# Patient Record
Sex: Female | Born: 1955 | Race: Black or African American | Hispanic: No | Smoking: Former smoker
Health system: Southern US, Community
[De-identification: ages and names within clinical notes are randomized; demographics above are authoritative.]

## PROBLEM LIST (undated history)

## (undated) DIAGNOSIS — E119 Type 2 diabetes mellitus without complications: Secondary | ICD-10-CM

## (undated) DIAGNOSIS — E7800 Pure hypercholesterolemia, unspecified: Secondary | ICD-10-CM

## (undated) DIAGNOSIS — I10 Essential (primary) hypertension: Secondary | ICD-10-CM

---

## 2003-06-02 ENCOUNTER — Ambulatory Visit (HOSPITAL_COMMUNITY): Admission: RE | Admit: 2003-06-02 | Discharge: 2003-06-02 | Payer: Self-pay | Admitting: *Deleted

## 2007-08-26 ENCOUNTER — Other Ambulatory Visit: Admission: RE | Admit: 2007-08-26 | Discharge: 2007-08-26 | Payer: Self-pay | Admitting: Gynecology

## 2008-07-13 ENCOUNTER — Observation Stay (HOSPITAL_COMMUNITY): Admission: EM | Admit: 2008-07-13 | Discharge: 2008-07-15 | Payer: Self-pay | Admitting: Emergency Medicine

## 2008-07-13 ENCOUNTER — Ambulatory Visit: Payer: Self-pay | Admitting: Internal Medicine

## 2008-07-13 ENCOUNTER — Emergency Department (HOSPITAL_COMMUNITY): Admission: EM | Admit: 2008-07-13 | Discharge: 2008-07-13 | Payer: Self-pay | Admitting: Family Medicine

## 2010-05-14 LAB — COMPREHENSIVE METABOLIC PANEL
AST: 21 U/L (ref 0–37)
CO2: 30 mEq/L (ref 19–32)
Chloride: 102 mEq/L (ref 96–112)
Creatinine, Ser: 0.71 mg/dL (ref 0.4–1.2)
GFR calc non Af Amer: >60 mL/min (ref >60)
Glucose, Bld: 89 mg/dL (ref 70–99)
Potassium: 3.7 mEq/L (ref 3.5–5.1)
Sodium: 139 mEq/L (ref 135–145)
Total Bilirubin: 0.7 mg/dL (ref 0.3–1.2)

## 2010-05-14 LAB — DIFFERENTIAL
Lymphocytes Relative: 28 % (ref 12–46)
Lymphs Abs: 2.1 10*3/uL (ref 0.7–4.0)
Monocytes Relative: 7 % (ref 3–12)
Neutrophils Relative %: 65 % (ref 43–77)

## 2010-05-14 LAB — CBC
HCT: 42 % (ref 36.0–46.0)
HCT: 44.8 % (ref 36.0–46.0)
Hemoglobin: 14.8 g/dL (ref 12.0–15.0)
MCHC: 33 g/dL (ref 30.0–36.0)
MCHC: 33.6 g/dL (ref 30.0–36.0)
MCHC: 34 g/dL (ref 30.0–36.0)
MCV: 84 fL (ref 78.0–100.0)
MCV: 85.6 fL (ref 78.0–100.0)
Platelets: 198 10*3/uL (ref 150–400)
Platelets: 243 10*3/uL (ref 150–400)
RBC: 4.52 MIL/uL (ref 3.87–5.11)
RBC: 4.91 MIL/uL (ref 3.87–5.11)
RDW: 13.3 % (ref 11.5–15.5)
RDW: 13.3 % (ref 11.5–15.5)
WBC: 6.7 10*3/uL (ref 4.0–10.5)

## 2010-05-14 LAB — GLUCOSE, CAPILLARY
Glucose-Capillary: 112 mg/dL — ABNORMAL HIGH (ref 70–99)
Glucose-Capillary: 125 mg/dL — ABNORMAL HIGH (ref 70–99)
Glucose-Capillary: 142 mg/dL — ABNORMAL HIGH (ref 70–99)
Glucose-Capillary: 150 mg/dL — ABNORMAL HIGH (ref 70–99)
Glucose-Capillary: 159 mg/dL — ABNORMAL HIGH (ref 70–99)
Glucose-Capillary: 161 mg/dL — ABNORMAL HIGH (ref 70–99)
Glucose-Capillary: 175 mg/dL — ABNORMAL HIGH (ref 70–99)

## 2010-05-14 LAB — BASIC METABOLIC PANEL
BUN: 12 mg/dL (ref 6–23)
BUN: 8 mg/dL (ref 6–23)
CO2: 29 mEq/L (ref 19–32)
Chloride: 105 mEq/L (ref 96–112)
Creatinine, Ser: 0.73 mg/dL (ref 0.4–1.2)
Potassium: 3.8 mEq/L (ref 3.5–5.1)
Potassium: 6.2 mEq/L — ABNORMAL HIGH (ref 3.5–5.1)
Sodium: 136 mEq/L (ref 135–145)

## 2010-05-14 LAB — HEPARIN LEVEL (UNFRACTIONATED)
Heparin Unfractionated: 0.31 IU/mL (ref 0.30–0.70)
Heparin Unfractionated: <0.1 IU/mL — ABNORMAL LOW (ref 0.30–0.70)

## 2010-05-14 LAB — HEMOGLOBIN A1C
Hgb A1c MFr Bld: 6.2 % — ABNORMAL HIGH (ref 4.6–6.1)
Mean Plasma Glucose: 131 mg/dL

## 2010-05-14 LAB — LIPID PANEL
Cholesterol: 181 mg/dL (ref 0–200)
Triglycerides: 164 mg/dL — ABNORMAL HIGH (ref <150)

## 2010-05-14 LAB — CK TOTAL AND CKMB (NOT AT ARMC)
CK, MB: 1.2 ng/mL (ref 0.3–4.0)
Relative Index: INVALID (ref 0.0–2.5)
Total CK: 87 U/L (ref 7–177)

## 2010-05-14 LAB — CARDIAC PANEL(CRET KIN+CKTOT+MB+TROPI): CK, MB: 0.9 ng/mL (ref 0.3–4.0)

## 2010-05-14 LAB — POCT CARDIAC MARKERS: Troponin i, poc: <0.05 ng/mL (ref 0.00–0.09)

## 2010-05-14 LAB — PROTIME-INR: INR: 1 (ref 0.00–1.49)

## 2010-05-14 LAB — APTT: aPTT: 35 seconds (ref 24–37)

## 2010-06-19 NOTE — H&P (Signed)
Lori Crane           ACCOUNT NO.:  [id number]   MEDICAL RECORD NO.:  [id number]          PATIENT TYPE:  INP   LOCATION:  3703                         FACILITY:  [facility]   PHYSICIAN:  Hillis Range, MD       DATE OF BIRTH:  04/16/1955   DATE OF ADMISSION:  07/13/2008  DATE OF DISCHARGE:                              HISTORY & PHYSICAL   PRIMARY CARDIOLOGIST:  New, will be seen by Dr. Hillis Range.   PRIMARY CARE:  The patient is followed by the [facility] at [facility].   HISTORY OF PRESENT ILLNESS:  Lori Crane is a delightful 55 year old  African American female with no known history of coronary artery  disease, multiple risk factors including uncontrolled hypertension,  uncontrolled diabetes, ongoing tobacco abuse, questionable  hypercholesteremia, and noncompliance with medication and diet,  sedentary lifestyle, and family history of coronary artery disease in  her father.  Lori Crane presents with chest discomfort, onset this  morning after awakening, had some stuttering, sharp pressure  substernally, no associated symptoms, that sort of waxed and waned for a  few minutes and disappeared and then on the way to work it returned.  She got to work and tried to do her work.  Chest discomfort returned.  She mentioned to the staff at work and decided she need to get  evaluated.  The patient states that the discomfort scared her enough  that she sought medical care because she knows that she does not take  care of herself and she is afraid that something is seriously wrong.  Unfortunately, she is not taking her diabetic medications or her blood  pressure medications in a few days.  She states she just did not like  taking them.  She also is noncompliant with her diabetes.  She does not  check her CBGs and does not follow a diabetic diet.  On arrival to the  emergency room, she was given GI cocktail and nitroglycerin paste with  relief in her discomfort.  Currently  is pain free but very anxious about  being in the hospital.   PAST MEDICAL HISTORY:  1. Hypertension.  2. Diabetes.  3. Tobacco abuse.  4. Noncompliance.   SOCIAL HISTORY:  She lives in [place].  Her mother lives with her.  She has adult children.  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  She is divorced.  She  smokes 1-pack a day.  Denies any regular exercise.  Has an occasional  rum and coke.  Does not follow a restricted diet.  Denies any drug or  illicit substance use.   FAMILY HISTORY:  Mother alive and well.  Father deceased at age 35.  He  had a MI and also had a history of diabetes.   REVIEW OF SYSTEMS:  Positive for chest pain, dyspnea on exertion.  All  other systems reviewed and negative.   ALLERGIES:  No known drug allergies.   CURRENT MEDICATIONS:  1. Aspirin 81.  2. Micardis/hydrochlorothiazide 80/25.  3. Actos Plus 15/500 mg b.i.d., although the patient states  when she      takes it she usually just takes it once a day.   PHYSICAL EXAMINATION:  VITAL SIGNS:  Temperature 98.7, heart rate 81,  respirations 20, blood pressure 155/100, currently she is 162/126, and  saturating 100% on room air.  GENERAL:  In no acute distress.  She is currently pain-free and very  anxious.  HEENT:  Unremarkable.  NECK:  Supple without lymphadenopathy or bruits.  CARDIOVASCULAR:  S1-S2.  Regular rate and rhythm.  LUNGS:  Clear to auscultation bilaterally.  SKIN:  Warm and dry.  ABDOMEN:  Soft and nontender.  Positive bowel sounds.  LOWER EXTREMITIES:  Without clubbing, cyanosis, or edema.  NEUROLOGIC:  Alert and oriented x3.   LABORATORY DATA:  Chest x-ray showing low volume film without acute  cardiopulmonary process.  EKG:  Sinus rhythm, rate is 75.  Point-of-care  negative x1.  Hematocrit 44.8.  Potassium 6.2.  Blood sample was  hemolyzed.  Creatinine 0.6 and glucose 123.   IMPRESSION:  Chest pain, somewhat  typical-atypical features, however,  concerning for angina in patient with multiple comorbidities including  uncontrolled diabetes, uncontrolled hypertension, medical noncompliance,  tobacco abuse, sedentary lifestyle, and family history of coronary  artery disease.  Dr. Hillis Range has been in to examine and assess the  patient.  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  Benefit of cardiac catheterization versus stress Myoview  have been discussed with the patient.  The patient also proceeded with  cardiac catheterization for further evaluation.  The risks and benefits  have been discussed with the patient.  Also continue aspirin, initiate  beta-blocker, continue Micardis, initiate statin, check fasting lipids,  hemoglobin A1c, and TSH.  Tobacco cessation and diabetic education will  be provided.  Anticoagulate with heparin and plan on cardiac  catheterization in the a.m.      Dorian Pod, ACNP      Hillis Range, MD  Electronically Signed    MB/MEDQ  D:  07/13/2008  T:  07/14/2008  Job:  [id number]

## 2010-06-19 NOTE — Cardiovascular Report (Signed)
Lori Crane, Lori Crane           ACCOUNT NO.:  [id number]   MEDICAL RECORD NO.:  [id number]          PATIENT TYPE:  INP   LOCATION:  3703                         FACILITY:  [facility]   PHYSICIAN:  Verne Carrow, MDDATE OF BIRTH:  July 15, 1955   DATE OF PROCEDURE:  07/14/2008  DATE OF DISCHARGE:                            CARDIAC CATHETERIZATION   PROCEDURE PERFORMED:  1. Left heart catheterization.  2. Selective coronary angiography.  3. Left ventricular angiogram.   OPERATOR:  Verne Carrow, MD   INDICATIONS:  Chest pain in a 55 year old African American female with a  history of tobacco abuse, diabetes mellitus and hypertension who has  been noncompliant with her medications.   PROCEDURE IN DETAIL:  The patient was brought to the Inpatient Cardiac  Catheterization Laboratory after signing informed consent for the  procedure.  The right groin was prepped and draped in sterile fashion.  Lidocaine 1% was used for local anesthesia.  A 5-French sheath was  inserted into the right femoral artery without difficulty.  Standard  diagnostic catheters were used to perform selective coronary  angiography.  A pigtail catheter was used to perform a left ventricular  angiogram.  The patient tolerated the procedure well and taken to the  holding area in stable condition.   ANGIOGRAPHIC FINDINGS:  1. The left main coronary artery had no evidence of disease.  2. The circumflex artery is a large-caliber vessel that has serial 30%      lesions in the proximal and midportion.  The first second obtuse      marginal branches are small without any significant disease.  The      third obtuse marginal branch is moderate size with no significant      disease.  3. The left anterior descending is a large vessel that courses to the      apex and has luminal irregularities throughout the proximal and      midportion.  There is a moderate-sized diagonal branch that is free      of any  significant disease.  4. The right coronary artery has proximal 30% stenosis and mild      luminal irregularities in the midportion.  There is no flow-      limiting disease in this system.  This is a moderate-sized vessel.  5. The left ventricular angiogram was performed in the RAO projection      shows a normal systolic function with no wall motion abnormalities.      There is no evidence of mitral regurgitation.  Ejection fraction      was estimated at 60%.   IMPRESSION:  1. Mild nonobstructive coronary artery disease.  2. Normal left ventricular systolic function.  3. Uncontrolled hypertension.  4. Noncardiac etiology of chest pain.   RECOMMENDATIONS:  No further cardiac workup is indicated at this time.  The patient does need better control for blood pressure.  I will add  Norvasc 5 mg once daily.  She will have 3 hours of bedrest so will be  discharged to home later today.  We will add a proton pump inhibitor  prior to discharge.  Verne Carrow, MD  Electronically Signed     CM/MEDQ  D:  07/14/2008  T:  07/14/2008  Job:  [id number]

## 2010-06-19 NOTE — Discharge Summary (Signed)
NAMEBALINDA, Lori Crane           ACCOUNT NO.:  [id number]   MEDICAL RECORD NO.:  [id number]          PATIENT TYPE:  INP   LOCATION:  3703                         FACILITY:  [facility]   PHYSICIAN:  Rollene Rotunda, MD, FACCDATE OF BIRTH:  Mar 19, 1955   DATE OF ADMISSION:  07/13/2008  DATE OF DISCHARGE:  07/15/2008                               DISCHARGE SUMMARY   PROCEDURES:  1. Cardiac catheterization.  2. Coronary arteriogram.  3. Left ventriculogram.   PRIMARY FINAL DISCHARGE DIAGNOSIS:  Chest pain, no significant coronary  artery disease at catheterization, and ejection fraction is normal.   SECONDARY DIAGNOSES:  1. Uncontrolled hypertension, medications added this admission.  2. Diabetes.  3. Tobacco abuse.  4. History of noncompliant.  5. Family history of coronary artery disease in her father.  6. Obesity with a body mass index of 32.   TIME AT DISCHARGE:  36 minutes.   HOSPITAL COURSE:  [name] is a 55 year old female with no previous  history of chest pain.  She had chest pain and came to the hospital  where she was admitted for further evaluation.   Her cardiac enzymes were negative for MI.  A hemoglobin A1c was 6.2.  Total cholesterol 181, triglycerides 164, HDL 37, and LDL 111.  TSH  1.016.  Other labs within normal limits.  Chest x-ray showed no acute  disease.  Because of recurrent episodes of chest pain, she had a cardiac  catheterization on July 14, 2008.  Her cardiac catheterization showed  minimal coronary artery disease with 30% lesions in the LAD, circumflex  and RCA.  Her EF was 60% with no wall motion abnormalities and no MR.  Dr. Clifton James reviewed the films and felt that she had mild  nonobstructive coronary artery disease with preserved left ventricular  systolic function.  He felt that her chest pain was noncardiac and her  blood pressure was poorly controlled.  Over the course of her hospital  stay, she has smoking cessation consult and  nitroglycerin patches were  recommended to help her quit.  She had metoprolol and Norvasc added to  her medication regimen and by discharge, her systolic blood pressure was  in the 130s to 150s which was significantly improved.   On July 15, 2008, [name] was evaluated by Dr. Antoine Poche.  She was  ambulating without chest pain or shortness of breath and considered  stable for discharge with primary care outpatient followup.   DISCHARGE INSTRUCTIONS:  1. Her activity levels to include no lifting for week and no driving      for 2 days.  2. She is to stick to low-sodium diabetic diet.  3. She is to call our office for problems with the cath site.  4. She is to return to [facility] within 2 weeks for groin      check.  5. She is to follow up with Dr. Johney Frame on a p.r.n. basis.   DISCHARGE MEDICATIONS:  1. Norvasc 5 mg daily.  2. Metoprolol 25 mg b.i.d.  3. Actos plus 15/500, hold for 48 hours and restart on July 17, 2008.  4. Micardis  HCT 80/25 mg daily.  5. Aspirin 81 mg daily.      Theodore Demark, PA-C      Rollene Rotunda, MD, [facility]  Electronically Signed    RB/MEDQ  D:  07/15/2008  T:  07/16/2008  Job:  [id number]

## 2010-06-22 NOTE — H&P (Signed)
Lori Crane, Lori Crane                     ACCOUNT NO.:  [id number]   MEDICAL RECORD NO.:  [id number]                   PATIENT TYPE:  AMB   LOCATION:  DAY                                  FACILITY:  [facility]   PHYSICIAN:  Almedia Balls. Fore, M.D.                DATE OF BIRTH:  1955/11/23   DATE OF ADMISSION:  06/02/2003  DATE OF DISCHARGE:                                HISTORY & PHYSICAL   CHIEF COMPLAINT:  Bleeding heavily.   HISTORY:  Patient is a 55 year old gravida 4, para 4, whose last menstrual  period started in October of 2004 and has been continually bleeding since  that time.  Examination was done in October with findings of uterus  approximately [redacted] weeks gestational size.  Ultrasound was performed which  showed irregular cavity and enlargement of the uterus.  The patient  underwent hysteroscopy D&C in mid October 2004 with benign report and  diffuse progestational changes from hormone attempts to suppress her  bleeding.  She has been followed since that time with use of hormone agents  to attempt to suppress the bleeding.  Pap smear has not been done because of  bleeding at times of examination but endocervical curetting at the time of  the [facility] showed benign endocervix without evidence of any neoplastic changes.  She is admitted at this time after having had hemoglobin checks at 10 grams  earlier and recently at 7.5 grams for definitive therapy, which would  include abdominal hysterectomy, possibly bilateral salpingo-oophorectomy.  She has been fully counseled as to the nature of the procedure and the risks  involved to include risk of anesthesia; injury to bowel, bladder, blood  vessels, ureters; postoperative hemorrhage; infection; recuperation; and  possible hormone replacement should her ovaries be removed.  She fully  understands all these considerations and wishes to proceed on June 02, 2003.   PAST MEDICAL HISTORY:  Includes tubal ligation x2 in that she was  pregnant  following a failed tubal ligation attempt years ago.   MEDICATIONS:  She is on antihypertensives, which she takes regularly.   ALLERGIES:  She is allergic to no medications.   SOCIAL HISTORY:  She smokes approximately one pack of cigarettes a day,  drinks occasional alcohol, and has two cups of coffee a day.   FAMILY HISTORY:  Includes father who died of coronary artery disease and  myocardial infarction.  Father had type 2 diabetes mellitus.  Maternal  grandmother had breast cancer.   REVIEW OF SYSTEMS:  HEENT:  Negative.  CARDIORESPIRATORY:  As noted above.  GASTROINTESTINAL:  Negative.  GENITOURINARY:  As in the present illness.  NEUROMUSCULAR:  Negative.   PHYSICAL EXAMINATION:  VITAL SIGNS:  Height 5 feet 5 inches, weight 177  pounds, blood pressure 138/68, pulse 84, respirations 18.  GENERAL:  Well-developed black female in no acute distress.Marland Kitchen  HEENT:  Within normal limits.  NECK:  Supple without masses, adenopathy or  bruit.  HEART:  Regular rate and rhythm without murmurs.  LUNGS:  Clear to P&A.  BREASTS:  Examined sitting and lying without mass.  Axilla negative.  ABDOMEN:  Soft with slight mass effect in the lower abdominal area above the  symphysis pubis.  This is very slightly tender.  PELVIC:  External genitalia, Bartholin, urethral, and Skene's glands within  normal limits.  Vagina reveals small to moderate amount of dark blood  present.  Cervix is not well visualized in that it is high into the vagina.  The uterus is approximately [redacted] weeks gestational size and irregular.  There  are no palpable adnexal masses.  Anterior and posterior cul-de-sac exam is  confirmatory.  EXTREMITIES:  Within normal limits.  CENTRAL NERVOUS SYSTEM:  Grossly intact.  SKIN:  Without suspicious lesions.   IMPRESSION:  1. Abnormal uterine bleeding.  2. Anemia secondary to above.  3. Uterine enlargement.   DISPOSITION:  As noted above.                                                Almedia Balls. Randell Patient, M.D.    SRF/MEDQ  D:  05/30/2003  T:  05/30/2003  Job:  [id number]

## 2013-07-08 ENCOUNTER — Ambulatory Visit: Payer: BC Managed Care – PPO | Admitting: *Deleted

## 2013-07-09 ENCOUNTER — Emergency Department (HOSPITAL_COMMUNITY): Payer: BC Managed Care – PPO

## 2013-07-09 ENCOUNTER — Encounter (HOSPITAL_COMMUNITY): Payer: Self-pay | Admitting: Emergency Medicine

## 2013-07-09 ENCOUNTER — Emergency Department (HOSPITAL_COMMUNITY)
Admission: EM | Admit: 2013-07-09 | Discharge: 2013-07-09 | Disposition: A | Discharge status: Home / Self Care | Payer: BC Managed Care – PPO | Attending: Emergency Medicine | Admitting: Emergency Medicine

## 2013-07-09 DIAGNOSIS — Z7982 Long term (current) use of aspirin: Secondary | ICD-10-CM | POA: Insufficient documentation

## 2013-07-09 DIAGNOSIS — E7800 Pure hypercholesterolemia, unspecified: Secondary | ICD-10-CM | POA: Insufficient documentation

## 2013-07-09 DIAGNOSIS — Z79899 Other long term (current) drug therapy: Secondary | ICD-10-CM | POA: Insufficient documentation

## 2013-07-09 DIAGNOSIS — Z794 Long term (current) use of insulin: Secondary | ICD-10-CM | POA: Insufficient documentation

## 2013-07-09 DIAGNOSIS — R739 Hyperglycemia, unspecified: Secondary | ICD-10-CM

## 2013-07-09 DIAGNOSIS — N201 Calculus of ureter: Secondary | ICD-10-CM | POA: Insufficient documentation

## 2013-07-09 DIAGNOSIS — I10 Essential (primary) hypertension: Secondary | ICD-10-CM | POA: Insufficient documentation

## 2013-07-09 DIAGNOSIS — E119 Type 2 diabetes mellitus without complications: Secondary | ICD-10-CM | POA: Insufficient documentation

## 2013-07-09 DIAGNOSIS — Z87891 Personal history of nicotine dependence: Secondary | ICD-10-CM | POA: Insufficient documentation

## 2013-07-09 HISTORY — DX: Pure hypercholesterolemia, unspecified: E78.00

## 2013-07-09 HISTORY — DX: Type 2 diabetes mellitus without complications: E11.9

## 2013-07-09 HISTORY — DX: Essential (primary) hypertension: I10

## 2013-07-09 LAB — URINE MICROSCOPIC-ADD ON

## 2013-07-09 LAB — BASIC METABOLIC PANEL
BUN: 10 mg/dL (ref 6–23)
CALCIUM: 9.8 mg/dL (ref 8.4–10.5)
CO2: 28 mEq/L (ref 19–32)
Chloride: 92 mEq/L — ABNORMAL LOW (ref 96–112)
Creatinine, Ser: 0.67 mg/dL (ref 0.50–1.10)
GFR calc Af Amer: >90 mL/min (ref >90)
GLUCOSE: 338 mg/dL — AB (ref 70–99)
Potassium: 4.2 mEq/L (ref 3.7–5.3)
Sodium: 134 mEq/L — ABNORMAL LOW (ref 137–147)

## 2013-07-09 LAB — CBC
HEMATOCRIT: 41.8 % (ref 36.0–46.0)
HEMOGLOBIN: 14.3 g/dL (ref 12.0–15.0)
MCH: 28.1 pg (ref 26.0–34.0)
MCHC: 34.2 g/dL (ref 30.0–36.0)
MCV: 82.3 fL (ref 78.0–100.0)
Platelets: 221 10*3/uL (ref 150–400)
RBC: 5.08 MIL/uL (ref 3.87–5.11)
RDW: 12.7 % (ref 11.5–15.5)
WBC: 6.1 10*3/uL (ref 4.0–10.5)

## 2013-07-09 LAB — URINALYSIS, ROUTINE W REFLEX MICROSCOPIC
BILIRUBIN URINE: NEGATIVE
Glucose, UA: >1000 mg/dL — AB
Hgb urine dipstick: NEGATIVE
KETONES UR: NEGATIVE mg/dL
NITRITE: NEGATIVE
Protein, ur: NEGATIVE mg/dL
SPECIFIC GRAVITY, URINE: 1.038 — AB (ref 1.005–1.030)
UROBILINOGEN UA: 1 mg/dL (ref 0.0–1.0)
pH: 6 (ref 5.0–8.0)

## 2013-07-09 LAB — CBG MONITORING, ED: Glucose-Capillary: 227 mg/dL — ABNORMAL HIGH (ref 70–99)

## 2013-07-09 MED ORDER — OXYCODONE-ACETAMINOPHEN 5-325 MG PO TABS: 1.0000 | ORAL_TABLET | Freq: Four times a day (QID) | ORAL | Status: DC | PRN

## 2013-07-09 MED ORDER — TAMSULOSIN HCL 0.4 MG PO CAPS: 0.4000 mg | ORAL_CAPSULE | Freq: Every day | ORAL | Status: DC

## 2013-07-09 MED ORDER — SODIUM CHLORIDE 0.9 % IV BOLUS (SEPSIS)
1000.0000 mL | Freq: Once | INTRAVENOUS | Status: AC
  Administered 2013-07-09: 1000 mL via INTRAVENOUS

## 2013-07-09 MED ORDER — ONDANSETRON HCL 4 MG PO TABS: 4.0000 mg | ORAL_TABLET | Freq: Four times a day (QID) | ORAL | Status: DC

## 2013-07-09 NOTE — ED Notes (Signed)
Patient states L flank pain.   Patient denies other symptoms.

## 2013-07-09 NOTE — ED Notes (Signed)
Patient states generalized weakness.  Denies urinary symptoms.

## 2013-07-09 NOTE — ED Notes (Signed)
MD at bedside. 

## 2013-07-09 NOTE — ED Notes (Signed)
Apologized to pt for wait time. Pt reports continued left sided flank pain that radiates to groin. Denies N/V or abdominal pain.

## 2013-07-09 NOTE — ED Notes (Signed)
Patient returned from CT

## 2013-07-09 NOTE — Discharge Instructions (Signed)
Return to the ED with any concerns including fever/chills, vomiting, worsening pain not controlled by pain medications, decreased level of alertness/lethargy, or any other alarming symptoms °

## 2013-07-09 NOTE — ED Notes (Signed)
Apologized to pt for wait time.

## 2013-07-09 NOTE — ED Notes (Signed)
CBG 227 

## 2013-07-09 NOTE — ED Provider Notes (Signed)
CSN: [id number]     Arrival date & time 07/09/13  1225 History   First MD Initiated Contact with Patient 07/09/13 1618     Chief Complaint  Patient presents with  . Flank Pain     (Consider location/radiation/quality/duration/timing/severity/associated sxs/prior Treatment) HPI Pt presenting with c/o left sided flank and back pain.  Pt states pain comes and goes and has been going on intermittently over the past 3 days.  No dysuria.  No trauma/falls or new activities.  No fever/chills.  No vomiting.  Pt has been using a heating pad which has provided some relief.  No blood in urine.  No hx of similar pain.  Pain described as sharp and aching.  There are no other associated systemic symptoms, there are no other alleviating or modifying factors.   Past Medical History  Diagnosis Date  . Diabetes mellitus without complication   . Hypertension   . Hypercholesterolemia    History reviewed. No pertinent past surgical history. No family history on file. History  Substance Use Topics  . Smoking status: Former Games developermoker  . Smokeless tobacco: Not on file  . Alcohol Use: No   OB History   Grav Para Term Preterm Abortions TAB SAB Ect Mult Living                 Review of Systems ROS reviewed and all otherwise negative except for mentioned in HPI    Allergies  Review of patient's allergies indicates no known allergies.  Home Medications   Prior to Admission medications   Medication Sig Start Date End Date Taking? Authorizing Provider  aspirin 81 MG tablet Take 81 mg by mouth daily.   Yes Historical Provider, MD  Canagliflozin (INVOKANA) 100 MG TABS Take 1 tablet by mouth daily.   Yes Historical Provider, MD  insulin glargine (LANTUS) 100 UNIT/ML injection Inject 32 Units into the skin at bedtime.   Yes Historical Provider, MD  metoprolol (LOPRESSOR) 50 MG tablet Take 50 mg by mouth 2 (two) times daily.   Yes Historical Provider, MD  simvastatin (ZOCOR) 20 MG tablet Take 20 mg by mouth  daily.   Yes Historical Provider, MD  SitaGLIPtin-MetFORMIN HCl (JANUMET XR) 50-1000 MG TB24 Take 1 tablet by mouth 2 (two) times daily. Takes twice daily per pharmacy   Yes Historical Provider, MD  telmisartan-hydrochlorothiazide (MICARDIS HCT) 80-25 MG per tablet Take 1 tablet by mouth 2 (two) times daily. Takes twice daily per pharmacy   Yes Historical Provider, MD  ondansetron (ZOFRAN) 4 MG tablet Take 1 tablet (4 mg total) by mouth every 6 (six) hours. 07/09/13   Ethelda ChickMartha K Linker, MD  oxyCODONE-acetaminophen (PERCOCET/ROXICET) 5-325 MG per tablet Take 1-2 tablets by mouth every 6 (six) hours as needed for severe pain. 07/09/13   Ethelda ChickMartha K Linker, MD  tamsulosin (FLOMAX) 0.4 MG CAPS capsule Take 1 capsule (0.4 mg total) by mouth daily. 07/09/13   Ethelda ChickMartha K Linker, MD   BP 115/66  Pulse 80  Temp(Src) 99.3 F (37.4 C) (Oral)  Resp 14  Ht 5\' 5"  (1.651 m)  Wt 203 lb (92.08 kg)  BMI 33.78 kg/m2  SpO2 100% Vitals reviewed Physical Exam Physical Examination: General appearance - alert, well appearing, and in no distress Mental status - alert, oriented to person, place, and time Eyes - no conjunctival injection, no scleral icterus Mouth - mucous membranes moist, pharynx normal without lesions Chest - clear to auscultation, no wheezes, rales or rhonchi, symmetric air entry Heart - normal rate,  regular rhythm, normal S1, S2, no murmurs, rubs, clicks or gallops Abdomen - soft, nontender, nondistended, no masses or organomegaly Back exam - no midline tenderness to palpation, no left CVA mild tenderness Extremities - peripheral pulses normal, no pedal edema, no clubbing or cyanosis Skin - normal coloration and turgor, no rashes  ED Course  Procedures (including critical care time) Labs Review Labs Reviewed  URINALYSIS, ROUTINE W REFLEX MICROSCOPIC - Abnormal; Notable for the following:    APPearance HAZY (*)    Specific Gravity, Urine 1.038 (*)    Glucose, UA >1000 (*)    Leukocytes, UA SMALL (*)     All other components within normal limits  URINE MICROSCOPIC-ADD ON - Abnormal; Notable for the following:    Bacteria, UA FEW (*)    All other components within normal limits  BASIC METABOLIC PANEL - Abnormal; Notable for the following:    Sodium 134 (*)    Chloride 92 (*)    Glucose, Bld 338 (*)    All other components within normal limits  CBG MONITORING, ED - Abnormal; Notable for the following:    Glucose-Capillary 227 (*)    All other components within normal limits  CBC    Imaging Review Ct Abdomen Pelvis Wo Contrast  07/09/2013   CLINICAL DATA:  FLANK PAIN  EXAM: CT ABDOMEN AND PELVIS WITHOUT CONTRAST  TECHNIQUE: Multidetector CT imaging of the abdomen and pelvis was performed following the standard protocol without IV contrast.  COMPARISON:  None.  FINDINGS: Minimal atelectasis versus scarring within the lung bases.  Noncontrast evaluation of the liver, spleen, adrenals, pancreas, and right kidney is unremarkable.  2 mm calculus in the distal left ureter. No evidence of hydroureter, hydronephrosis, nor nephrolithiasis on the left.  Multiple phleboliths appreciated within the pelvis.  Within limitations of a noncontrast CT. The bowel is negative. The appendix is identified and unremarkable. No abdominal or pelvic masses, free fluid, loculated fluid collections, nor adenopathy.  Atherosclerotic calcifications within the aorta without evidence of an abdominal aortic aneurysm.  No abdominal wall no right inguinal hernia.  There are no aggressive appearing osseous lesions.  IMPRESSION: 2 mm nonobstructing calculus distal left ureter.  Moderate amount of fecal retention  No further evidence of abdominal pelvic pathology.   Electronically Signed   By: Salome Holmes M.D.   On: 07/09/2013 17:26     EKG Interpretation None      MDM   Final diagnoses:  Ureteral stone  Hyperglycemia    Pt presenting with c/o left flank pain for the past 3 days.  CT shows 62mm left ureteral stone.  No  signs of UTI, no fever/chills.  Pt has some hyperglycemia, improved after IV fluids.  No ketosis.  Pt declined pain medication in the ED, given prescriptions for home use and information for urology followup.  Discharged with strict return precautions.  Pt agreeable with plan.    Ethelda Chick, MD 07/10/13 1140

## 2013-08-26 ENCOUNTER — Ambulatory Visit: Payer: BC Managed Care – PPO | Admitting: *Deleted

## 2015-01-24 ENCOUNTER — Encounter (HOSPITAL_COMMUNITY): Payer: Self-pay | Admitting: Emergency Medicine

## 2015-01-24 ENCOUNTER — Emergency Department (HOSPITAL_COMMUNITY)
Admission: EM | Admit: 2015-01-24 | Discharge: 2015-01-24 | Disposition: A | Discharge status: Home / Self Care | Payer: BC Managed Care – PPO | Attending: Emergency Medicine | Admitting: Emergency Medicine

## 2015-01-24 ENCOUNTER — Emergency Department (HOSPITAL_COMMUNITY): Payer: BC Managed Care – PPO

## 2015-01-24 DIAGNOSIS — I10 Essential (primary) hypertension: Secondary | ICD-10-CM | POA: Insufficient documentation

## 2015-01-24 DIAGNOSIS — Z87891 Personal history of nicotine dependence: Secondary | ICD-10-CM | POA: Insufficient documentation

## 2015-01-24 DIAGNOSIS — S62609A Fracture of unspecified phalanx of unspecified finger, initial encounter for closed fracture: Secondary | ICD-10-CM

## 2015-01-24 DIAGNOSIS — S62621A Displaced fracture of medial phalanx of left index finger, initial encounter for closed fracture: Secondary | ICD-10-CM | POA: Insufficient documentation

## 2015-01-24 DIAGNOSIS — E119 Type 2 diabetes mellitus without complications: Secondary | ICD-10-CM | POA: Diagnosis not present

## 2015-01-24 DIAGNOSIS — Y9389 Activity, other specified: Secondary | ICD-10-CM | POA: Diagnosis not present

## 2015-01-24 DIAGNOSIS — Z79899 Other long term (current) drug therapy: Secondary | ICD-10-CM | POA: Insufficient documentation

## 2015-01-24 DIAGNOSIS — Y998 Other external cause status: Secondary | ICD-10-CM | POA: Insufficient documentation

## 2015-01-24 DIAGNOSIS — Y9289 Other specified places as the place of occurrence of the external cause: Secondary | ICD-10-CM | POA: Insufficient documentation

## 2015-01-24 DIAGNOSIS — S6992XA Unspecified injury of left wrist, hand and finger(s), initial encounter: Secondary | ICD-10-CM | POA: Diagnosis present

## 2015-01-24 DIAGNOSIS — Z7982 Long term (current) use of aspirin: Secondary | ICD-10-CM | POA: Insufficient documentation

## 2015-01-24 DIAGNOSIS — E7800 Pure hypercholesterolemia, unspecified: Secondary | ICD-10-CM | POA: Diagnosis not present

## 2015-01-24 DIAGNOSIS — X58XXXA Exposure to other specified factors, initial encounter: Secondary | ICD-10-CM | POA: Diagnosis not present

## 2015-01-24 DIAGNOSIS — Z794 Long term (current) use of insulin: Secondary | ICD-10-CM | POA: Diagnosis not present

## 2015-01-24 MED ORDER — HYDROCODONE-ACETAMINOPHEN 5-325 MG PO TABS: ORAL_TABLET | ORAL | Status: DC

## 2015-01-24 NOTE — ED Notes (Signed)
Pain and swelling to left index finger rates pain 10/10.  History of DM.

## 2015-01-24 NOTE — Discharge Instructions (Signed)
Finger Fracture °Finger fractures are breaks in the bones of the fingers. There are many types of fractures. There are also different ways of treating these fractures. Your doctor will talk with you about the best way to treat your fracture. °Injury is the main cause of broken fingers. This includes: °· Injuries while playing sports. °· Workplace injuries. °· Falls. °HOME CARE °· Follow your doctor's instructions for: °¨ Activities. °¨ Exercises. °¨ Physical therapy. °· Take medicines only as told by your doctor for pain, discomfort, or fever. °GET HELP IF: °You have pain or swelling that limits: °· The motion of your fingers. °· The use of your fingers. °GET HELP RIGHT AWAY IF: °· You cannot feel your fingers, or your fingers become numb. °  °This information is not intended to replace advice given to you by your health care provider. Make sure you discuss any questions you have with your health care provider. °  °Document Released: 07/10/2007 Document Revised: 02/11/2014 Document Reviewed: 09/02/2012 °Elsevier Interactive Patient Education ©2016 [employer]. ° °

## 2015-01-25 NOTE — ED Provider Notes (Signed)
CSN: [id number]     Arrival date & time 01/24/15  1635 History   First MD Initiated Contact with Patient 01/24/15 1650     Chief Complaint  Patient presents with  . Hand Pain    left index finger     (Consider location/radiation/quality/duration/timing/severity/associated sxs/prior Treatment) HPI   Lori Crane is a 59 y.o. female who presents to the Emergency Department complaining of pain and swelling to the left index finger.  She reports gradual onset of sx's for one day.  Pain is worse with bending the finger.  She has a ring on the affected finger and states the ring is tight, but still able to move it around.  She denies known injury, numbness or weakness or open wounds, also denies any sx's proximal to the finger.   Past Medical History  Diagnosis Date  . Diabetes mellitus without complication (HCC)   . Hypertension   . Hypercholesterolemia    History reviewed. No pertinent past surgical history. History reviewed. No pertinent family history. Social History  Substance Use Topics  . Smoking status: Former Games developer  . Smokeless tobacco: None  . Alcohol Use: No   OB History    No data available     Review of Systems  Constitutional: Negative for fever and chills.  Genitourinary: Negative for dysuria and difficulty urinating.  Musculoskeletal: Positive for joint swelling and arthralgias (left index finger pain and swelling).  Skin: Negative for color change and wound.  Neurological: Negative for numbness.  All other systems reviewed and are negative.     Allergies  Metformin and related  Home Medications   Prior to Admission medications   Medication Sig Start Date End Date Taking? Authorizing Provider  aspirin 81 MG tablet Take 81 mg by mouth daily.   Yes Historical Provider, MD  INVOKANA 300 MG TABS tablet Take 300 mg by mouth daily.  12/22/14  Yes Historical Provider, MD  metoprolol (LOPRESSOR) 50 MG tablet Take 50 mg by mouth daily.    Yes  Historical Provider, MD  ondansetron (ZOFRAN) 4 MG tablet Take 1 tablet (4 mg total) by mouth every 6 (six) hours. 07/09/13  Yes Jerelyn Scott, MD  oxyCODONE-acetaminophen (PERCOCET/ROXICET) 5-325 MG per tablet Take 1-2 tablets by mouth every 6 (six) hours as needed for severe pain. Patient taking differently: Take 1-2 tablets by mouth daily as needed for moderate pain or severe pain.  07/09/13  Yes Jerelyn Scott, MD  simvastatin (ZOCOR) 20 MG tablet Take 20 mg by mouth every evening.    Yes Historical Provider, MD  SitaGLIPtin-MetFORMIN HCl (JANUMET XR) 50-1000 MG TB24 Take 1 tablet by mouth 2 (two) times daily.    Yes Historical Provider, MD  telmisartan-hydrochlorothiazide (MICARDIS HCT) 80-25 MG per tablet Take 1 tablet by mouth daily.    Yes Historical Provider, MD  TOUJEO SOLOSTAR 300 UNIT/ML SOPN Inject 35 Units into the skin every morning.  01/23/15  Yes Historical Provider, MD  fluconazole (DIFLUCAN) 150 MG tablet Take 150 mg by mouth once.  01/23/15   Historical Provider, MD  HYDROcodone-acetaminophen (NORCO/VICODIN) 5-325 MG tablet Take one tab po q 4-6 hrs prn painTake one-two tabs po q 4-6 hrs prn pain 01/24/15   Verdun Rackley, PA-C   BP 139/89 mmHg  Pulse 86  Temp(Src) 98.3 F (36.8 C) (Oral)  Resp 16  Ht  (1.676 m)  Wt 79.379 kg  BMI 28.26 kg/m2  SpO2 100% Physical Exam  Constitutional: She is oriented to person, place, and  time. She appears well-developed and well-nourished. No distress.  HENT:  Head: Normocephalic and atraumatic.  Cardiovascular: Normal rate, regular rhythm and intact distal pulses.   Pulmonary/Chest: Effort normal and breath sounds normal.  Musculoskeletal: She exhibits tenderness. She exhibits no edema.  Localized edema of the left index finger. Pt has full ROM with ttp at the PIP.  Radial pulse is brisk, distal sensation intact.  CR< 2 sec.  No bony deformity.  No proximal tenderness  Neurological: She is alert and oriented to person, place, and  time. She exhibits normal muscle tone. Coordination normal.  Skin: Skin is warm and dry.  Nursing note and vitals reviewed.   ED Course  Procedures (including critical care time) Labs Review Labs Reviewed - No data to display  Imaging Review Dg Finger Index Left  01/24/2015  CLINICAL DATA:  Left index finger pain and swelling for 3 days. No known injury. EXAM: LEFT INDEX FINGER 2+V COMPARISON:  None. FINDINGS: Soft tissue swelling is seen which is most prominent in the region of the proximal interphalangeal joint. A tiny ossific density is seen along the volar plate of the middle phalanx adjacent to the PIP joint, which is suspicious for a tiny volar plate avulsion injury. No other fractures or bone lesions identified. No radiographic evidence of arthritis. IMPRESSION: Tiny ossific adensity along the volar plate of the middle phalanx adjacent to the PIP joint with associated soft tissue swelling. These findings are suspicious for tiny volar plate avulsion fracture ; recommend clinical correlation for point tenderness at this site. Electronically Signed   By: Myles RosenthalJohn  Stahl M.D.   On: 01/24/2015 17:39   I have personally reviewed and evaluated these images and lab results as part of my medical decision-making.    MDM   Final diagnoses:  Fracture, finger, closed, initial encounter   Edema of the finger, pt agrees to allow removal of the ring. Ring removed by nursing staff w/o difficulty using ring cutter.  XR reviewed and discussed with patient.  Finger splinted, pain improved.  Remains NV intact.  Agrees to orthopedic f/u.  vicodin for pain    Pauline Ausammy Angad Nabers, PA-C 01/25/15 2232  Azalia BilisKevin Campos, MD 01/25/15 2246

## 2015-02-07 ENCOUNTER — Encounter: Payer: Self-pay | Admitting: *Deleted

## 2015-02-07 ENCOUNTER — Ambulatory Visit: Payer: BC Managed Care – PPO | Admitting: Orthopedic Surgery

## 2016-02-11 ENCOUNTER — Emergency Department (HOSPITAL_COMMUNITY): Payer: BC Managed Care – PPO

## 2016-02-11 ENCOUNTER — Emergency Department (HOSPITAL_COMMUNITY)
Admission: EM | Admit: 2016-02-11 | Discharge: 2016-02-11 | Disposition: A | Discharge status: Home / Self Care | Payer: BC Managed Care – PPO | Attending: Emergency Medicine | Admitting: Emergency Medicine

## 2016-02-11 ENCOUNTER — Encounter (HOSPITAL_COMMUNITY): Payer: Self-pay | Admitting: Emergency Medicine

## 2016-02-11 DIAGNOSIS — E119 Type 2 diabetes mellitus without complications: Secondary | ICD-10-CM | POA: Insufficient documentation

## 2016-02-11 DIAGNOSIS — S299XXA Unspecified injury of thorax, initial encounter: Secondary | ICD-10-CM | POA: Diagnosis present

## 2016-02-11 DIAGNOSIS — Y92009 Unspecified place in unspecified non-institutional (private) residence as the place of occurrence of the external cause: Secondary | ICD-10-CM | POA: Diagnosis not present

## 2016-02-11 DIAGNOSIS — Y999 Unspecified external cause status: Secondary | ICD-10-CM | POA: Diagnosis not present

## 2016-02-11 DIAGNOSIS — Z87891 Personal history of nicotine dependence: Secondary | ICD-10-CM | POA: Insufficient documentation

## 2016-02-11 DIAGNOSIS — Y939 Activity, unspecified: Secondary | ICD-10-CM | POA: Insufficient documentation

## 2016-02-11 DIAGNOSIS — Z79899 Other long term (current) drug therapy: Secondary | ICD-10-CM | POA: Insufficient documentation

## 2016-02-11 DIAGNOSIS — S2232XA Fracture of one rib, left side, initial encounter for closed fracture: Secondary | ICD-10-CM

## 2016-02-11 DIAGNOSIS — W01198A Fall on same level from slipping, tripping and stumbling with subsequent striking against other object, initial encounter: Secondary | ICD-10-CM | POA: Diagnosis not present

## 2016-02-11 DIAGNOSIS — I10 Essential (primary) hypertension: Secondary | ICD-10-CM | POA: Diagnosis not present

## 2016-02-11 DIAGNOSIS — Z7984 Long term (current) use of oral hypoglycemic drugs: Secondary | ICD-10-CM | POA: Insufficient documentation

## 2016-02-11 DIAGNOSIS — Z7982 Long term (current) use of aspirin: Secondary | ICD-10-CM | POA: Diagnosis not present

## 2016-02-11 MED ORDER — ACETAMINOPHEN 325 MG PO TABS
650.0000 mg | ORAL_TABLET | Freq: Once | ORAL | Status: AC
  Administered 2016-02-11: 650 mg via ORAL
  Filled 2016-02-11: qty 2

## 2016-02-11 MED ORDER — HYDROCODONE-ACETAMINOPHEN 5-325 MG PO TABS: 1.0000 | ORAL_TABLET | Freq: Four times a day (QID) | ORAL | 0 refills | Status: DC | PRN

## 2016-02-11 NOTE — ED Provider Notes (Signed)
[facility]-EMERGENCY DEPT Provider Note   CSN: [id number] Arrival date & time: 02/11/16  0801     History   Chief Complaint Chief Complaint  Patient presents with  . Fall  . Rib Injury    left side    HPI Lori Crane is a 61 y.o. female.Patient tripped and fell last night in her home over a foot Stool at 9 PM, striking her left ribs on a countertop. She complains of left lateral non radiating rib pain since the event denies abdominal pain denies other injury. Denies shortness of breath. Pain is worse with changing positions improved with remaining still. No treatment prior to coming here. No other associated symptoms  HPI  Past Medical History:  Diagnosis Date  . Diabetes mellitus without complication (HCC)   . Hypercholesterolemia   . Hypertension     There are no active problems to display for this patient.   History reviewed. No pertinent surgical history.  OB History    No data available       Home Medications    Prior to Admission medications   Medication Sig Start Date End Date Taking? Authorizing Provider  aspirin 81 MG tablet Take 81 mg by mouth daily.    Historical Provider, MD  fluconazole (DIFLUCAN) 150 MG tablet Take 150 mg by mouth once.  01/23/15   Historical Provider, MD  HYDROcodone-acetaminophen (NORCO) 5-325 MG tablet Take 1 tablet by mouth every 6 (six) hours as needed for severe pain. 02/11/16   Doug Sou, MD  INVOKANA 300 MG TABS tablet Take 300 mg by mouth daily.  12/22/14   Historical Provider, MD  metoprolol (LOPRESSOR) 50 MG tablet Take 50 mg by mouth daily.     Historical Provider, MD  ondansetron (ZOFRAN) 4 MG tablet Take 1 tablet (4 mg total) by mouth every 6 (six) hours. 07/09/13   Jerelyn Scott, MD  oxyCODONE-acetaminophen (PERCOCET/ROXICET) 5-325 MG per tablet Take 1-2 tablets by mouth every 6 (six) hours as needed for severe pain. Patient taking differently: Take 1-2 tablets by mouth daily as needed for moderate pain or severe pain.   07/09/13   Jerelyn Scott, MD  simvastatin (ZOCOR) 20 MG tablet Take 20 mg by mouth every evening.     Historical Provider, MD  SitaGLIPtin-MetFORMIN HCl (JANUMET XR) 50-1000 MG TB24 Take 1 tablet by mouth 2 (two) times daily.     Historical Provider, MD  telmisartan-hydrochlorothiazide (MICARDIS HCT) 80-25 MG per tablet Take 1 tablet by mouth daily.     Historical Provider, MD  TOUJEO SOLOSTAR 300 UNIT/ML SOPN Inject 35 Units into the skin every morning.  01/23/15   Historical Provider, MD    Family History No family history on file.  Social History Social History  Substance Use Topics  . Smoking status: Former Games developer  . Smokeless tobacco: Former Neurosurgeon  . Alcohol use No     Allergies   Metformin and related   Review of Systems Review of Systems  Constitutional: Negative.   HENT: Negative.   Respiratory: Negative.   Cardiovascular: Positive for chest pain.       Left rib pain  Gastrointestinal: Negative.   Musculoskeletal: Negative.   Skin: Negative.   Allergic/Immunologic: Positive for immunocompromised state.       Diabetic  Neurological: Negative.   Psychiatric/Behavioral: Negative.   All other systems reviewed and are negative.    Physical Exam Updated Vital Signs BP 143/92 (BP Location: Right Arm)   Pulse 74   Temp 98.5 F (  36.9 C) (Oral)   Resp 18   SpO2 99%   Physical Exam  Constitutional: She appears well-developed and well-nourished. She appears distressed.  HENT:  Head: Normocephalic and atraumatic.  Eyes: Conjunctivae are normal. Pupils are equal, round, and reactive to light.  Neck: Neck supple. No tracheal deviation present. No thyromegaly present.  Cardiovascular: Normal rate and regular rhythm.   No murmur heard. Pulmonary/Chest: Effort normal and breath sounds normal. She exhibits tenderness.  No ecchymosis . Tender at mid anterolateral lateral chest. No crepitance. No flail  Abdominal: Soft. Bowel sounds are normal. She exhibits no distension.  There is no tenderness.  Musculoskeletal: Normal range of motion. She exhibits no edema or tenderness.  Neurological: She is alert. Coordination normal.  Skin: Skin is warm and dry. No rash noted.  Psychiatric: She has a normal mood and affect.  Nursing note and vitals reviewed.    ED Treatments / Results  Labs (all labs ordered are listed, but only abnormal results are displayed) Labs Reviewed - No data to display  EKG  EKG Interpretation None     X-rays viewed by me  Radiology Dg Ribs Unilateral W/chest Left  Result Date: 02/11/2016 CLINICAL DATA:  61 year old female with history of fall yesterday evening injuring the left side of the anterior chest body kitchen counter top. Left-sided chest and breast pain, worse during inspiration and with movement. EXAM: LEFT RIBS AND CHEST - 3+ VIEW COMPARISON:  Chest x-ray 07/13/2008. FINDINGS: Trace left pleural effusion. No consolidative airspace disease. No pneumothorax. No evidence of pulmonary edema. Heart size is normal. Upper mediastinal contours are within normal limits. Aortic atherosclerosis. Dedicated views of the left ribs demonstrate a subtle nondisplaced fracture in the lateral aspect of the left ninth rib. IMPRESSION: 1. Nondisplaced fracture in the lateral aspect of left ninth rib with small left pleural effusion. No pneumothorax. 2. Aortic atherosclerosis. Electronically Signed   By: Trudie Reedaniel  Entrikin M.D.   On: 02/11/2016 08:57    Procedures Procedures (including critical care time)  Medications Ordered in ED Medications  acetaminophen (TYLENOL) tablet 650 mg (not administered)     Initial Impression / Assessment and Plan / ED Course  I have reviewed the triage vital signs and the nursing notes.  Pertinent labs & imaging results that were available during my care of the patient were reviewed by me and considered in my medical decision making (see chart for details).  Clinical Course     She'll get Tylenol prior to  discharge as she is driving. [facility] [place] [place] [facility]. Incentive spirometer to go follow-up PMD as needed  Final Clinical Impressions(s) / ED Diagnoses  Diagnosis #1 fall #2 left ninth rib fracture Final diagnoses:  Closed fracture of one rib of left side, initial encounter    New Prescriptions New Prescriptions   HYDROCODONE-ACETAMINOPHEN (NORCO) 5-325 MG TABLET    Take 1 tablet by mouth every 6 (six) hours as needed for severe pain.     Doug SouSam Rodert Hinch, MD 02/11/16 [phone]

## 2016-02-11 NOTE — ED Notes (Signed)
Went over [employer] papers and explained spirometer use with pt.

## 2016-02-11 NOTE — Discharge Instructions (Signed)
Take Tylenol for mild pain or the pain medicine prescribed for bad pain. Don't take Tylenol together with the pain medicine prescribed as the combination can be dangerous to your liver. Use the incentive spirometer every 10-15 minutes while awake. If pain is not well controlled after 3 or 4 days, follow up with your primary care physician. Return if concern for any reason

## 2016-02-11 NOTE — ED Triage Notes (Signed)
Pt. Stated, I feel last night on my left side, I triped over a step chair. When I move a certain way it really hurts or when I move a certain way it hurts on the left side.

## 2016-05-04 ENCOUNTER — Emergency Department (HOSPITAL_COMMUNITY)
Admission: EM | Admit: 2016-05-04 | Discharge: 2016-05-05 | Disposition: A | Discharge status: Home / Self Care | Payer: BC Managed Care – PPO | Attending: Emergency Medicine | Admitting: Emergency Medicine

## 2016-05-04 ENCOUNTER — Emergency Department (HOSPITAL_COMMUNITY): Payer: BC Managed Care – PPO

## 2016-05-04 ENCOUNTER — Encounter (HOSPITAL_COMMUNITY): Payer: Self-pay | Admitting: *Deleted

## 2016-05-04 DIAGNOSIS — Z87891 Personal history of nicotine dependence: Secondary | ICD-10-CM | POA: Insufficient documentation

## 2016-05-04 DIAGNOSIS — R0689 Other abnormalities of breathing: Secondary | ICD-10-CM | POA: Diagnosis not present

## 2016-05-04 DIAGNOSIS — Z7982 Long term (current) use of aspirin: Secondary | ICD-10-CM | POA: Insufficient documentation

## 2016-05-04 DIAGNOSIS — R600 Localized edema: Secondary | ICD-10-CM | POA: Diagnosis not present

## 2016-05-04 DIAGNOSIS — Z7984 Long term (current) use of oral hypoglycemic drugs: Secondary | ICD-10-CM | POA: Diagnosis not present

## 2016-05-04 DIAGNOSIS — E119 Type 2 diabetes mellitus without complications: Secondary | ICD-10-CM | POA: Diagnosis not present

## 2016-05-04 DIAGNOSIS — M7989 Other specified soft tissue disorders: Secondary | ICD-10-CM | POA: Diagnosis present

## 2016-05-04 DIAGNOSIS — E876 Hypokalemia: Secondary | ICD-10-CM | POA: Diagnosis not present

## 2016-05-04 DIAGNOSIS — R609 Edema, unspecified: Secondary | ICD-10-CM

## 2016-05-04 DIAGNOSIS — I10 Essential (primary) hypertension: Secondary | ICD-10-CM | POA: Diagnosis not present

## 2016-05-04 DIAGNOSIS — N390 Urinary tract infection, site not specified: Secondary | ICD-10-CM | POA: Diagnosis not present

## 2016-05-04 LAB — CBC WITH DIFFERENTIAL/PLATELET
BASOS ABS: 0 10*3/uL (ref 0.0–0.1)
BASOS PCT: 0 %
EOS ABS: 0.1 10*3/uL (ref 0.0–0.7)
Eosinophils Relative: 1 %
HEMATOCRIT: 44.4 % (ref 36.0–46.0)
HEMOGLOBIN: 15.3 g/dL — AB (ref 12.0–15.0)
Lymphocytes Relative: 35 %
Lymphs Abs: 2.8 10*3/uL (ref 0.7–4.0)
MCH: 28.2 pg (ref 26.0–34.0)
MCHC: 34.5 g/dL (ref 30.0–36.0)
MCV: 81.8 fL (ref 78.0–100.0)
Monocytes Absolute: 0.5 10*3/uL (ref 0.1–1.0)
Monocytes Relative: 7 %
NEUTROS ABS: 4.5 10*3/uL (ref 1.7–7.7)
NEUTROS PCT: 57 %
Platelets: 274 10*3/uL (ref 150–400)
RBC: 5.43 MIL/uL — AB (ref 3.87–5.11)
RDW: 13.5 % (ref 11.5–15.5)
WBC: 7.9 10*3/uL (ref 4.0–10.5)

## 2016-05-04 LAB — COMPREHENSIVE METABOLIC PANEL
ALBUMIN: 1.9 g/dL — AB (ref 3.5–5.0)
ALK PHOS: 65 U/L (ref 38–126)
ALT: 11 U/L — AB (ref 14–54)
ANION GAP: 6 (ref 5–15)
AST: 18 U/L (ref 15–41)
BILIRUBIN TOTAL: 0.2 mg/dL — AB (ref 0.3–1.2)
BUN: 12 mg/dL (ref 6–20)
CO2: 30 mmol/L (ref 22–32)
CREATININE: 0.69 mg/dL (ref 0.44–1.00)
Calcium: 8.8 mg/dL — ABNORMAL LOW (ref 8.9–10.3)
Chloride: 100 mmol/L — ABNORMAL LOW (ref 101–111)
GFR calc non Af Amer: >60 mL/min (ref >60)
GLUCOSE: 113 mg/dL — AB (ref 65–99)
Potassium: 3.1 mmol/L — ABNORMAL LOW (ref 3.5–5.1)
Sodium: 136 mmol/L (ref 135–145)
TOTAL PROTEIN: 6 g/dL — AB (ref 6.5–8.1)

## 2016-05-04 LAB — URINALYSIS, ROUTINE W REFLEX MICROSCOPIC
Bilirubin Urine: NEGATIVE
Glucose, UA: NEGATIVE mg/dL
Ketones, ur: NEGATIVE mg/dL
LEUKOCYTES UA: NEGATIVE
Nitrite: NEGATIVE
SPECIFIC GRAVITY, URINE: 1.016 (ref 1.005–1.030)
pH: 6 (ref 5.0–8.0)

## 2016-05-04 LAB — BRAIN NATRIURETIC PEPTIDE: B NATRIURETIC PEPTIDE 5: 67 pg/mL (ref 0.0–100.0)

## 2016-05-04 MED ORDER — ENOXAPARIN SODIUM 150 MG/ML ~~LOC~~ SOLN
1.5000 mg/kg | Freq: Once | SUBCUTANEOUS | Status: AC
  Administered 2016-05-04: 125 mg via SUBCUTANEOUS
  Filled 2016-05-04: qty 1

## 2016-05-04 MED ORDER — CEPHALEXIN 500 MG PO CAPS: 500.0000 mg | ORAL_CAPSULE | Freq: Four times a day (QID) | ORAL | 0 refills | Status: DC

## 2016-05-04 MED ORDER — POTASSIUM CHLORIDE CRYS ER 20 MEQ PO TBCR
40.0000 meq | EXTENDED_RELEASE_TABLET | Freq: Once | ORAL | Status: AC
  Administered 2016-05-04: 40 meq via ORAL
  Filled 2016-05-04: qty 2

## 2016-05-04 NOTE — ED Notes (Signed)
Pt brought back to triage to reassess pt's vital signs.

## 2016-05-04 NOTE — ED Notes (Signed)
Pt given meal tray and diet gingerale per Dr Clarene Duke okay.

## 2016-05-04 NOTE — ED Notes (Signed)
Pt irate that she has not been taken back for a room

## 2016-05-04 NOTE — ED Triage Notes (Signed)
Pt c/o bilateral leg swelling that started 2 days ago. Denies SOB. Pt has no hx of leg swelling.

## 2016-05-04 NOTE — ED Notes (Signed)
Pt ambulated to and from bathroom with no difficulties noted.

## 2016-05-04 NOTE — Discharge Instructions (Signed)
Return to the hospital in the morning to obtain an outpatient ultrasound of your leg.  This ultrasound will check to make sure you do not have a "blood clot" in the veins in your leg.  You received an injection of a "blood thinning" medication during your Emergency Department visit today as prophylaxis ("precautionary treatment") for a "blood clot" in your leg.  You will receive the results of your testing shortly after having it completed.  If there is a blood clot in your leg, you will be re-directed to the Emergency Department for further evaluation.  Call your regular medical doctor on Monday to schedule a follow up appointment this week.  Return to the Emergency Department immediately if worsening.

## 2016-05-04 NOTE — ED Notes (Signed)
Pt to the desk- upset that she has  Not been taken back and that fast track patients are being seen. Fast track explained to pt and pt is assured that she will be seen as soon as possible.

## 2016-05-04 NOTE — ED Provider Notes (Signed)
[facility]-EMERGENCY DEPT Provider Note   CSN: [id number] Arrival date & time: 05/04/16  1638     History   Chief Complaint Chief Complaint  Patient presents with  . Leg Swelling    HPI Lori Crane is a 61 y.o. female.     Pt was seen at 2145. Per pt, c/o gradual onset and persistence of constant bilat LE's swelling (R>L) for the past 3 days. Pt denies hx of same. Denies LE's pain, no calf pain, no joints pain, no injury, no SOB/cough, no CP/palpitations, no abd pain, no N/V/D, no fevers, no rash, no back pain.   Past Medical History:  Diagnosis Date  . Diabetes mellitus without complication (HCC)   . Hypercholesterolemia   . Hypertension     There are no active problems to display for this patient.   History reviewed. No pertinent surgical history.  OB History    No data available       Home Medications    Prior to Admission medications   Medication Sig Start Date End Date Taking? Authorizing Provider  aspirin 81 MG tablet Take 81 mg by mouth daily.    Historical Provider, MD  fluconazole (DIFLUCAN) 150 MG tablet Take 150 mg by mouth once.  01/23/15   Historical Provider, MD  HYDROcodone-acetaminophen (NORCO) 5-325 MG tablet Take 1 tablet by mouth every 6 (six) hours as needed for severe pain. 02/11/16   Doug Sou, MD  INVOKANA 300 MG TABS tablet Take 300 mg by mouth daily.  12/22/14   Historical Provider, MD  metoprolol (LOPRESSOR) 50 MG tablet Take 50 mg by mouth daily.     Historical Provider, MD  ondansetron (ZOFRAN) 4 MG tablet Take 1 tablet (4 mg total) by mouth every 6 (six) hours. 07/09/13   Jerelyn Scott, MD  oxyCODONE-acetaminophen (PERCOCET/ROXICET) 5-325 MG per tablet Take 1-2 tablets by mouth every 6 (six) hours as needed for severe pain. Patient taking differently: Take 1-2 tablets by mouth daily as needed for moderate pain or severe pain.  07/09/13   Jerelyn Scott, MD  simvastatin (ZOCOR) 20 MG tablet Take 20 mg by mouth every evening.      Historical Provider, MD  SitaGLIPtin-MetFORMIN HCl (JANUMET XR) 50-1000 MG TB24 Take 1 tablet by mouth 2 (two) times daily.     Historical Provider, MD  telmisartan-hydrochlorothiazide (MICARDIS HCT) 80-25 MG per tablet Take 1 tablet by mouth daily.     Historical Provider, MD  TOUJEO SOLOSTAR 300 UNIT/ML SOPN Inject 35 Units into the skin every morning.  01/23/15   Historical Provider, MD    Family History No family history on file.  Social History Social History  Substance Use Topics  . Smoking status: Former Games developer  . Smokeless tobacco: Former Neurosurgeon  . Alcohol use Yes     Comment: socially      Allergies   Metformin and related   Review of Systems Review of Systems ROS: Statement: All systems negative except as marked or noted in the HPI; Constitutional: Negative for fever and chills. ; ; Eyes: Negative for eye pain, redness and discharge. ; ; ENMT: Negative for ear pain, hoarseness, nasal congestion, sinus pressure and sore throat. ; ; Cardiovascular: Negative for chest pain, palpitations, diaphoresis, dyspnea and +peripheral edema. ; ; Respiratory: Negative for cough, wheezing and stridor. ; ; Gastrointestinal: Negative for nausea, vomiting, diarrhea, abdominal pain, blood in stool, hematemesis, jaundice and rectal bleeding. . ; ; Genitourinary: Negative for dysuria, flank pain and hematuria. ; ; Musculoskeletal:  Negative for back pain and neck pain. Negative for swelling and trauma.; ; Skin: Negative for pruritus, rash, abrasions, blisters, bruising and skin lesion.; ; Neuro: Negative for headache, lightheadedness and neck stiffness. Negative for weakness, altered level of consciousness, altered mental status, extremity weakness, paresthesias, involuntary movement, seizure and syncope.      Physical Exam Updated Vital Signs BP (!) 183/93   Pulse 74   Temp 98.7 F (37.1 C) (Oral)   Resp 17   Ht  (1.676 m)   Wt 182 lb (82.6 kg)   SpO2 97%   BMI 29.38 kg/m   Physical  Exam 2150: Physical examination:  Nursing notes reviewed; Vital signs and O2 SAT reviewed;  Constitutional: Well developed, Well nourished, Well hydrated, In no acute distress; Head:  Normocephalic, atraumatic; Eyes: EOMI, PERRL, No scleral icterus; ENMT: Mouth and pharynx normal, Mucous membranes moist; Neck: Supple, Full range of motion, No lymphadenopathy; Cardiovascular: Regular rate and rhythm, No gallop; Respiratory: Breath sounds clear & equal bilaterally, No wheezes.  Speaking full sentences with ease, Normal respiratory effort/excursion; Chest: Nontender, Movement normal; Abdomen: Soft, Nontender, Nondistended, Normal bowel sounds; Genitourinary: No CVA tenderness; Spine:  No midline CS, TS, LS tenderness.;; Extremities: Pulses normal, No tenderness, +1 pedal edema LLE, +2 pedal edema RLE with calf asymmetry. NT right knee/ankle/foot.; Neuro: AA&Ox3, Major CN grossly intact.  Speech clear. No gross focal motor or sensory deficits in extremities.; Skin: Color normal, Warm, Dry.   ED Treatments / Results  Labs (all labs ordered are listed, but only abnormal results are displayed)   EKG  EKG Interpretation None       Radiology   Procedures Procedures (including critical care time)  Medications Ordered in ED Medications  potassium chloride SA (K-DUR,KLOR-CON) CR tablet 40 mEq (40 mEq Oral Given 05/04/16 2301)     Initial Impression / Assessment and Plan / ED Course  I have reviewed the triage vital signs and the nursing notes.  Pertinent labs & imaging results that were available during my care of the patient were reviewed by me and considered in my medical decision making (see chart for details).  MDM Reviewed: previous chart, nursing note and vitals Reviewed previous: labs Interpretation: labs and x-ray   Results for orders placed or performed during the hospital encounter of 05/04/16  CBC with Differential  Result Value Ref Range   WBC 7.9 4.0 - 10.5 K/uL   RBC 5.43  (H) 3.87 - 5.11 MIL/uL   Hemoglobin 15.3 (H) 12.0 - 15.0 g/dL   HCT 16.1 09.6 - 04.5 %   MCV 81.8 78.0 - 100.0 fL   MCH 28.2 26.0 - 34.0 pg   MCHC 34.5 30.0 - 36.0 g/dL   RDW 40.9 81.1 - 91.4 %   Platelets 274 150 - 400 K/uL   Neutrophils Relative % 57 %   Neutro Abs 4.5 1.7 - 7.7 K/uL   Lymphocytes Relative 35 %   Lymphs Abs 2.8 0.7 - 4.0 K/uL   Monocytes Relative 7 %   Monocytes Absolute 0.5 0.1 - 1.0 K/uL   Eosinophils Relative 1 %   Eosinophils Absolute 0.1 0.0 - 0.7 K/uL   Basophils Relative 0 %   Basophils Absolute 0.0 0.0 - 0.1 K/uL  Comprehensive metabolic panel  Result Value Ref Range   Sodium 136 135 - 145 mmol/L   Potassium 3.1 (L) 3.5 - 5.1 mmol/L   Chloride 100 (L) 101 - 111 mmol/L   CO2 30 22 - 32 mmol/L  Glucose, Bld 113 (H) 65 - 99 mg/dL   BUN 12 6 - 20 mg/dL   Creatinine, Ser 8.11 0.44 - 1.00 mg/dL   Calcium 8.8 (L) 8.9 - 10.3 mg/dL   Total Protein 6.0 (L) 6.5 - 8.1 g/dL   Albumin 1.9 (L) 3.5 - 5.0 g/dL   AST 18 15 - 41 U/L   ALT 11 (L) 14 - 54 U/L   Alkaline Phosphatase 65 38 - 126 U/L   Total Bilirubin 0.2 (L) 0.3 - 1.2 mg/dL   GFR calc non Af Amer >60 >60 mL/min   GFR calc Af Amer >60 >60 mL/min   Anion gap 6 5 - 15  Urinalysis, Routine w reflex microscopic  Result Value Ref Range   Color, Urine YELLOW YELLOW   APPearance HAZY (A) CLEAR   Specific Gravity, Urine 1.016 1.005 - 1.030   pH 6.0 5.0 - 8.0   Glucose, UA NEGATIVE NEGATIVE mg/dL   Hgb urine dipstick SMALL (A) NEGATIVE   Bilirubin Urine NEGATIVE NEGATIVE   Ketones, ur NEGATIVE NEGATIVE mg/dL   Protein, ur >=914 (A) NEGATIVE mg/dL   Nitrite NEGATIVE NEGATIVE   Leukocytes, UA NEGATIVE NEGATIVE   RBC / HPF 0-5 0 - 5 RBC/hpf   WBC, UA 6-30 0 - 5 WBC/hpf   Bacteria, UA RARE (A) NONE SEEN   Squamous Epithelial / LPF 0-5 (A) NONE SEEN   Mucous PRESENT    Granular Casts, UA PRESENT   Brain natriuretic peptide  Result Value Ref Range   B Natriuretic Peptide 67.0 0.0 - 100.0 pg/mL   Dg  Chest 2 View Result Date: 05/04/2016 CLINICAL DATA:  Lower extremity swelling. The upper back pain for several days. EXAM: CHEST  2 VIEW COMPARISON:  Chest and left rib radiographs 02/11/2016 FINDINGS: The cardiomediastinal contours are unchanged with atherosclerosis of the thoracic aorta. Minimal vascular congestion and cephalization. No pulmonary edema. No consolidation, pleural effusion, or pneumothorax. No acute osseous abnormalities are seen. Left rib fracture on prior rib series is not well visualized, may have healed. IMPRESSION: Minimal vascular congestion. No pulmonary edema. Atherosclerosis of the thoracic aorta. Electronically Signed   By: Rubye Oaks M.D.   On: 05/04/2016 21:10    2320:  Potassium repleted PO. Will dose SQ lovenox and have pt return for [place] tomorrow. Will tx for UTI. Pt has tol PO well while in the ED without N/V. Pt has ambulated with steady gait, easy resps, NAD. Dx and testing d/w pt.  Questions answered.  Verb understanding, agreeable to d/c home with outpt f/u.    Final Clinical Impressions(s) / ED Diagnoses   Final diagnoses:  None    New Prescriptions New Prescriptions   No medications on file      Samuel Jester, DO 05/07/16 1850

## 2016-05-05 ENCOUNTER — Ambulatory Visit (HOSPITAL_COMMUNITY)
Admission: RE | Admit: 2016-05-05 | Discharge: 2016-05-05 | Disposition: A | Discharge status: Home / Self Care | Payer: BC Managed Care – PPO | Source: Ambulatory Visit | Attending: Emergency Medicine | Admitting: Emergency Medicine

## 2016-05-05 DIAGNOSIS — I82411 Acute embolism and thrombosis of right femoral vein: Secondary | ICD-10-CM | POA: Insufficient documentation

## 2016-05-05 DIAGNOSIS — R600 Localized edema: Secondary | ICD-10-CM

## 2016-05-05 DIAGNOSIS — I82431 Acute embolism and thrombosis of right popliteal vein: Secondary | ICD-10-CM

## 2016-05-05 NOTE — ED Provider Notes (Signed)
Bilateral lower extremity Doppler studies performed. Results discussed with the patient. She has a DVT at the right popliteal vein and the left saphenous femoral junction. No chest pain or dyspnea. Will start Xarelto 15 mg twice a day for 21 days, then 20 mg daily. Discussion of the diagnosis and treatment options were given to the patient. She will follow-up with her primary care doctor this week.   Donnetta Hutching, MD 05/05/16 1122

## 2016-05-18 ENCOUNTER — Encounter (HOSPITAL_COMMUNITY): Payer: Self-pay | Admitting: Emergency Medicine

## 2016-05-18 ENCOUNTER — Emergency Department (HOSPITAL_COMMUNITY)
Admission: EM | Admit: 2016-05-18 | Discharge: 2016-05-18 | Disposition: A | Discharge status: Home / Self Care | Payer: BC Managed Care – PPO | Attending: Emergency Medicine | Admitting: Emergency Medicine

## 2016-05-18 DIAGNOSIS — Z79899 Other long term (current) drug therapy: Secondary | ICD-10-CM | POA: Diagnosis not present

## 2016-05-18 DIAGNOSIS — Z7982 Long term (current) use of aspirin: Secondary | ICD-10-CM | POA: Diagnosis not present

## 2016-05-18 DIAGNOSIS — E876 Hypokalemia: Secondary | ICD-10-CM | POA: Diagnosis not present

## 2016-05-18 DIAGNOSIS — K521 Toxic gastroenteritis and colitis: Secondary | ICD-10-CM | POA: Insufficient documentation

## 2016-05-18 DIAGNOSIS — I10 Essential (primary) hypertension: Secondary | ICD-10-CM | POA: Insufficient documentation

## 2016-05-18 DIAGNOSIS — E119 Type 2 diabetes mellitus without complications: Secondary | ICD-10-CM | POA: Diagnosis not present

## 2016-05-18 DIAGNOSIS — T3695XA Adverse effect of unspecified systemic antibiotic, initial encounter: Secondary | ICD-10-CM | POA: Insufficient documentation

## 2016-05-18 DIAGNOSIS — T5791XA Toxic effect of unspecified inorganic substance, accidental (unintentional), initial encounter: Secondary | ICD-10-CM | POA: Insufficient documentation

## 2016-05-18 DIAGNOSIS — Z87891 Personal history of nicotine dependence: Secondary | ICD-10-CM | POA: Insufficient documentation

## 2016-05-18 DIAGNOSIS — R197 Diarrhea, unspecified: Secondary | ICD-10-CM | POA: Diagnosis present

## 2016-05-18 LAB — COMPREHENSIVE METABOLIC PANEL
ALBUMIN: 1.8 g/dL — AB (ref 3.5–5.0)
ALT: 12 U/L — ABNORMAL LOW (ref 14–54)
ANION GAP: 6 (ref 5–15)
AST: 20 U/L (ref 15–41)
Alkaline Phosphatase: 69 U/L (ref 38–126)
BUN: 7 mg/dL (ref 6–20)
CHLORIDE: 103 mmol/L (ref 101–111)
CO2: 29 mmol/L (ref 22–32)
Calcium: 9.1 mg/dL (ref 8.9–10.3)
Creatinine, Ser: 0.75 mg/dL (ref 0.44–1.00)
GFR calc Af Amer: >60 mL/min (ref >60)
Glucose, Bld: 82 mg/dL (ref 65–99)
POTASSIUM: 3.4 mmol/L — AB (ref 3.5–5.1)
Sodium: 138 mmol/L (ref 135–145)
TOTAL PROTEIN: 6.1 g/dL — AB (ref 6.5–8.1)
Total Bilirubin: 0.3 mg/dL (ref 0.3–1.2)

## 2016-05-18 LAB — LIPASE, BLOOD: Lipase: 25 U/L (ref 11–51)

## 2016-05-18 LAB — CBC
HCT: 43.2 % (ref 36.0–46.0)
Hemoglobin: 14.6 g/dL (ref 12.0–15.0)
MCH: 27.8 pg (ref 26.0–34.0)
MCHC: 33.8 g/dL (ref 30.0–36.0)
MCV: 82.1 fL (ref 78.0–100.0)
PLATELETS: 239 10*3/uL (ref 150–400)
RBC: 5.26 MIL/uL — ABNORMAL HIGH (ref 3.87–5.11)
RDW: 13.9 % (ref 11.5–15.5)
WBC: 7.6 10*3/uL (ref 4.0–10.5)

## 2016-05-18 MED ORDER — POTASSIUM CHLORIDE CRYS ER 20 MEQ PO TBCR
40.0000 meq | EXTENDED_RELEASE_TABLET | Freq: Once | ORAL | Status: AC
  Administered 2016-05-18: 40 meq via ORAL
  Filled 2016-05-18: qty 2

## 2016-05-18 NOTE — ED Notes (Signed)
Patient able to ambulate independently.  Gait steady and even.

## 2016-05-18 NOTE — ED Provider Notes (Addendum)
[facility]-EMERGENCY DEPT Provider Note   CSN: [id number] Arrival date & time: 05/18/16  1440     History   Chief Complaint Chief Complaint  Patient presents with  . Diarrhea    HPI Lori Crane is a 61 y.o. female.  Patient c/o diarrhea in the past couple days. Several episodes. States it was loose, not watery. No gross malodor. No blood in stools. Denies any abd pain. No nausea or vomiting. Normal appetite. Recently completed a course of keflex for suspected uti. No current abx use. No known ill contacts or bad food ingestion. Also w recent dx dvt. Denies faintness or dizziness. States other than loose stools feels well. No fever or chills. No bm in past 2 hours.  Pt denies any recent wt loss.    The history is provided by the patient.  Diarrhea   Pertinent negatives include no abdominal pain, no vomiting and no chills.    Past Medical History:  Diagnosis Date  . Diabetes mellitus without complication (HCC)   . Hypercholesterolemia   . Hypertension     There are no active problems to display for this patient.   History reviewed. No pertinent surgical history.  OB History    No data available       Home Medications    Prior to Admission medications   Medication Sig Start Date End Date Taking? Authorizing Provider  aspirin 81 MG tablet Take 81 mg by mouth daily.    Historical Provider, MD  cephALEXin (KEFLEX) 500 MG capsule Take 1 capsule (500 mg total) by mouth 4 (four) times daily. 05/04/16   Samuel Jester, DO  fluconazole (DIFLUCAN) 150 MG tablet Take 150 mg by mouth once.  01/23/15   Historical Provider, MD  HYDROcodone-acetaminophen (NORCO) 5-325 MG tablet Take 1 tablet by mouth every 6 (six) hours as needed for severe pain. 02/11/16   Doug Sou, MD  INVOKANA 300 MG TABS tablet Take 300 mg by mouth daily.  12/22/14   Historical Provider, MD  metoprolol (LOPRESSOR) 50 MG tablet Take 50 mg by mouth daily.     Historical Provider, MD  ondansetron (ZOFRAN)  4 MG tablet Take 1 tablet (4 mg total) by mouth every 6 (six) hours. 07/09/13   Jerelyn Scott, MD  oxyCODONE-acetaminophen (PERCOCET/ROXICET) 5-325 MG per tablet Take 1-2 tablets by mouth every 6 (six) hours as needed for severe pain. Patient taking differently: Take 1-2 tablets by mouth daily as needed for moderate pain or severe pain.  07/09/13   Jerelyn Scott, MD  simvastatin (ZOCOR) 20 MG tablet Take 20 mg by mouth every evening.     Historical Provider, MD  SitaGLIPtin-MetFORMIN HCl (JANUMET XR) 50-1000 MG TB24 Take 1 tablet by mouth 2 (two) times daily.     Historical Provider, MD  telmisartan-hydrochlorothiazide (MICARDIS HCT) 80-25 MG per tablet Take 1 tablet by mouth daily.     Historical Provider, MD  TOUJEO SOLOSTAR 300 UNIT/ML SOPN Inject 35 Units into the skin every morning.  01/23/15   Historical Provider, MD    Family History No family history on file.  Social History Social History  Substance Use Topics  . Smoking status: Former Games developer  . Smokeless tobacco: Former Neurosurgeon  . Alcohol use Yes     Comment: socially      Allergies   Metformin and related   Review of Systems Review of Systems  Constitutional: Negative for chills and fever.  HENT: Negative for sore throat.   Eyes: Negative for redness.  Respiratory: Negative for shortness of breath.   Cardiovascular: Negative for chest pain.  Gastrointestinal: Positive for diarrhea. Negative for abdominal pain and vomiting.  Genitourinary: Negative for dysuria and flank pain.  Musculoskeletal: Negative for back pain.  Skin: Negative for rash.  Neurological: Negative for light-headedness.  Hematological: Does not bruise/bleed easily.  Psychiatric/Behavioral: Negative for confusion.     Physical Exam Updated Vital Signs BP (!) 172/94 (BP Location: Left Arm)   Pulse 81   Temp 98.8 F (37.1 C) (Oral)   Resp 17   Ht  (1.676 m)   Wt 82.6 kg   SpO2 97%   BMI 29.38 kg/m   Physical Exam  Constitutional: She  appears well-developed and well-nourished. No distress.  HENT:  Head: Atraumatic.  Eyes: Conjunctivae are normal. No scleral icterus.  Neck: Neck supple. No tracheal deviation present.  Cardiovascular: Normal rate, regular rhythm, normal heart sounds and intact distal pulses.   Pulmonary/Chest: Effort normal and breath sounds normal. No respiratory distress.  Abdominal: Soft. Normal appearance and bowel sounds are normal. She exhibits no distension. There is no tenderness. There is no guarding.  Genitourinary:  Genitourinary Comments: No cva tenderness  Musculoskeletal: She exhibits no edema.  Neurological: She is alert.  Skin: Skin is warm and dry. No rash noted. She is not diaphoretic.  Psychiatric: She has a normal mood and affect.  Nursing note and vitals reviewed.    ED Treatments / Results  Labs (all labs ordered are listed, but only abnormal results are displayed) Results for orders placed or performed during the hospital encounter of 05/18/16  Lipase, blood  Result Value Ref Range   Lipase 25 11 - 51 U/L  Comprehensive metabolic panel  Result Value Ref Range   Sodium 138 135 - 145 mmol/L   Potassium 3.4 (L) 3.5 - 5.1 mmol/L   Chloride 103 101 - 111 mmol/L   CO2 29 22 - 32 mmol/L   Glucose, Bld 82 65 - 99 mg/dL   BUN 7 6 - 20 mg/dL   Creatinine, Ser 9.56 0.44 - 1.00 mg/dL   Calcium 9.1 8.9 - 21.3 mg/dL   Total Protein 6.1 (L) 6.5 - 8.1 g/dL   Albumin 1.8 (L) 3.5 - 5.0 g/dL   AST 20 15 - 41 U/L   ALT 12 (L) 14 - 54 U/L   Alkaline Phosphatase 69 38 - 126 U/L   Total Bilirubin 0.3 0.3 - 1.2 mg/dL   GFR calc non Af Amer >60 >60 mL/min   GFR calc Af Amer >60 >60 mL/min   Anion gap 6 5 - 15  CBC  Result Value Ref Range   WBC 7.6 4.0 - 10.5 K/uL   RBC 5.26 (H) 3.87 - 5.11 MIL/uL   Hemoglobin 14.6 12.0 - 15.0 g/dL   HCT 08.6 57.8 - 46.9 %   MCV 82.1 78.0 - 100.0 fL   MCH 27.8 26.0 - 34.0 pg   MCHC 33.8 30.0 - 36.0 g/dL   RDW 62.9 52.8 - 41.3 %   Platelets 239 150  - 400 K/uL     EKG  EKG Interpretation None       Radiology No results found.  Procedures Procedures (including critical care time)  Medications Ordered in ED Medications  potassium chloride SA (K-DUR,KLOR-CON) CR tablet 40 mEq (not administered)     Initial Impression / Assessment and Plan / ED Course  I have reviewed the triage vital signs and the nursing notes.  Pertinent labs &  imaging results that were available during my care of the patient were reviewed by me and considered in my medical decision making (see chart for details).  k mildly low on lab work from today.  kcl po.  Tolerating po fluids. No nv. No abd pain.   Pt indicates no bm in past 2 hours.  Pt currently appears stable for d/c.     Final Clinical Impressions(s) / ED Diagnoses   Final diagnoses:  None    New Prescriptions New Prescriptions   No medications on file         Cathren Laine, MD 05/18/16 [phone]

## 2016-05-18 NOTE — Discharge Instructions (Signed)
It was our pleasure to provide your ER care today - we hope that you feel better.  Rest. Drink plenty of fluids.  Take imodium as need for diarrhea.  Your potassium is slightly low today - eat plenty of fruits and vegetables, and follow up with your doctor.  Return to ER if worse, new symptoms, severe diarrhea, weak/fainting, other concern.

## 2016-05-18 NOTE — ED Notes (Signed)
MD at bedside. 

## 2016-06-07 ENCOUNTER — Emergency Department (HOSPITAL_COMMUNITY)
Admission: EM | Admit: 2016-06-07 | Discharge: 2016-06-07 | Disposition: A | Discharge status: Home / Self Care | Payer: BC Managed Care – PPO | Attending: Emergency Medicine | Admitting: Emergency Medicine

## 2016-06-07 ENCOUNTER — Encounter (HOSPITAL_COMMUNITY): Payer: Self-pay

## 2016-06-07 DIAGNOSIS — I10 Essential (primary) hypertension: Secondary | ICD-10-CM | POA: Insufficient documentation

## 2016-06-07 DIAGNOSIS — E119 Type 2 diabetes mellitus without complications: Secondary | ICD-10-CM | POA: Diagnosis not present

## 2016-06-07 DIAGNOSIS — Z7902 Long term (current) use of antithrombotics/antiplatelets: Secondary | ICD-10-CM | POA: Diagnosis not present

## 2016-06-07 DIAGNOSIS — Z87891 Personal history of nicotine dependence: Secondary | ICD-10-CM | POA: Diagnosis not present

## 2016-06-07 DIAGNOSIS — Z79899 Other long term (current) drug therapy: Secondary | ICD-10-CM | POA: Insufficient documentation

## 2016-06-07 DIAGNOSIS — Z7984 Long term (current) use of oral hypoglycemic drugs: Secondary | ICD-10-CM | POA: Insufficient documentation

## 2016-06-07 LAB — BASIC METABOLIC PANEL
Anion gap: 5 (ref 5–15)
BUN: 15 mg/dL (ref 6–20)
CALCIUM: 8.6 mg/dL — AB (ref 8.9–10.3)
CO2: 30 mmol/L (ref 22–32)
CREATININE: 0.64 mg/dL (ref 0.44–1.00)
Chloride: 101 mmol/L (ref 101–111)
GFR calc Af Amer: >60 mL/min (ref >60)
GLUCOSE: 126 mg/dL — AB (ref 65–99)
Potassium: 3.9 mmol/L (ref 3.5–5.1)
Sodium: 136 mmol/L (ref 135–145)

## 2016-06-07 LAB — CBG MONITORING, ED: GLUCOSE-CAPILLARY: 86 mg/dL (ref 65–99)

## 2016-06-07 MED ORDER — HYDROCHLOROTHIAZIDE 12.5 MG PO CAPS
12.5000 mg | ORAL_CAPSULE | Freq: Once | ORAL | Status: AC
  Administered 2016-06-07: 12.5 mg via ORAL
  Filled 2016-06-07: qty 1

## 2016-06-07 MED ORDER — SITAGLIPTIN PHOS-METFORMIN HCL 50-1000 MG PO TABS: 1.0000 | ORAL_TABLET | Freq: Two times a day (BID) | ORAL | 0 refills | Status: DC

## 2016-06-07 MED ORDER — METOPROLOL TARTRATE 25 MG PO TABS
25.0000 mg | ORAL_TABLET | Freq: Once | ORAL | Status: AC
  Administered 2016-06-07: 25 mg via ORAL
  Filled 2016-06-07: qty 1

## 2016-06-07 MED ORDER — IRBESARTAN 150 MG PO TABS
150.0000 mg | ORAL_TABLET | Freq: Every day | ORAL | Status: DC
  Administered 2016-06-07: 150 mg via ORAL
  Filled 2016-06-07: qty 1

## 2016-06-07 NOTE — Discharge Instructions (Signed)
Follow up with your PCP. Continue medications--Change Metoprolol to PM, continue Micardis in AM

## 2016-06-07 NOTE — ED Provider Notes (Signed)
[facility]-EMERGENCY DEPT Provider Note   CSN: [id number] Arrival date & time: 06/07/16  1240     History   Chief Complaint Chief Complaint  Patient presents with  . Hypertension    HPI Lori Crane is a 61 y.o. female. Complaint is high blood pressure. She is asymptomatic.  HPI 61 year old female history of hypertension. Also has a history of DVT on request. She was seen by her doctor yesterday and her blood pressure was "a little high". She states it was 160. She went to work today. She did not take her medicines because there was going to be a potluck at work. She intended to wait and Delorse Limber taking her medications including both of her antihypertensives. She got to work and checked her blood pressure was high she went to the nurse and again was high and she presents here. She is to take her blood pressure medication.    Past Medical History:  Diagnosis Date  . Diabetes mellitus without complication (HCC)   . Hypercholesterolemia   . Hypertension     There are no active problems to display for this patient.   History reviewed. No pertinent surgical history.  OB History    No data available       Home Medications    Prior to Admission medications   Medication Sig Start Date End Date Taking? Authorizing Provider  empagliflozin (JARDIANCE) 25 MG TABS tablet Take 25 mg by mouth daily.  04/30/16  Yes Historical Provider, MD  gabapentin (NEURONTIN) 300 MG capsule TAKE 1 CAPSULE BY MOUTH TID AS NEEDED FOR PAIN 04/08/16  Yes Historical Provider, MD  metoprolol (LOPRESSOR) 50 MG tablet Take 50 mg by mouth daily.    Yes Historical Provider, MD  rivaroxaban (XARELTO) 20 MG TABS tablet Take 20 mg by mouth daily with supper.  05/23/16 05/23/17 Yes Historical Provider, MD  simvastatin (ZOCOR) 20 MG tablet Take 20 mg by mouth every evening.    Yes Historical Provider, MD  telmisartan-hydrochlorothiazide (MICARDIS HCT) 80-25 MG per tablet Take 1 tablet by mouth daily.    Yes  Historical Provider, MD  TOUJEO SOLOSTAR 300 UNIT/ML SOPN Inject 40 Units into the skin every morning.  01/23/15  Yes Historical Provider, MD  cephALEXin (KEFLEX) 500 MG capsule Take 1 capsule (500 mg total) by mouth 4 (four) times daily. Patient not taking: Reported on 06/07/2016 05/04/16   Samuel Jester, DO  HYDROcodone-acetaminophen (NORCO) 5-325 MG tablet Take 1 tablet by mouth every 6 (six) hours as needed for severe pain. Patient not taking: Reported on 06/07/2016 02/11/16   Doug Sou, MD  ondansetron (ZOFRAN) 4 MG tablet Take 1 tablet (4 mg total) by mouth every 6 (six) hours. Patient not taking: Reported on 06/07/2016 07/09/13   Jerelyn Scott, MD  oxyCODONE-acetaminophen (PERCOCET/ROXICET) 5-325 MG per tablet Take 1-2 tablets by mouth every 6 (six) hours as needed for severe pain. Patient not taking: Reported on 06/07/2016 07/09/13   Jerelyn Scott, MD  sitaGLIPtin-metformin (JANUMET) 50-1000 MG tablet Take 1 tablet by mouth 2 (two) times daily with a meal. 06/07/16   Rolland Porter, MD    Family History History reviewed. No pertinent family history.  Social History Social History  Substance Use Topics  . Smoking status: Former Games developer  . Smokeless tobacco: Former Neurosurgeon  . Alcohol use Yes     Comment: socially      Allergies   Metformin and related   Review of Systems Review of Systems  Constitutional: Negative for appetite change, chills,  diaphoresis, fatigue and fever.  HENT: Negative for mouth sores, sore throat and trouble swallowing.   Eyes: Negative for visual disturbance.  Respiratory: Negative for cough, chest tightness, shortness of breath and wheezing.   Cardiovascular: Negative for chest pain.  Gastrointestinal: Negative for abdominal distention, abdominal pain, diarrhea, nausea and vomiting.  Endocrine: Negative for polydipsia, polyphagia and polyuria.  Genitourinary: Negative for dysuria, frequency and hematuria.  Musculoskeletal: Negative for gait problem.  Skin:  Negative for color change, pallor and rash.  Neurological: Negative for dizziness, syncope, light-headedness and headaches.  Hematological: Does not bruise/bleed easily.  Psychiatric/Behavioral: Negative for behavioral problems and confusion.     Physical Exam Updated Vital Signs BP (!) 190/95 (BP Location: Right Arm)   Pulse 82   Temp 98.1 F (36.7 C) (Oral)   Resp 18   Ht 5\' 6"  (1.676 m)   Wt 189 lb (85.7 kg)   SpO2 98%   BMI 30.51 kg/m   Physical Exam  Constitutional: She is oriented to person, place, and time. She appears well-developed and well-nourished. No distress.  HENT:  Head: Normocephalic.  Eyes: Conjunctivae are normal. Pupils are equal, round, and reactive to light. No scleral icterus.  Neck: Normal range of motion. Neck supple. No thyromegaly present.  Cardiovascular: Normal rate and regular rhythm.  Exam reveals no gallop and no friction rub.   No murmur heard. Pulmonary/Chest: Effort normal and breath sounds normal. No respiratory distress. She has no wheezes. She has no rales.  Abdominal: Soft. Bowel sounds are normal. She exhibits no distension. There is no tenderness. There is no rebound.  Musculoskeletal: Normal range of motion.  Neurological: She is alert and oriented to person, place, and time.  Skin: Skin is warm and dry. No rash noted.  Psychiatric: She has a normal mood and affect. Her behavior is normal.     ED Treatments / Results  Labs (all labs ordered are listed, but only abnormal results are displayed) Labs Reviewed  BASIC METABOLIC PANEL - Abnormal; Notable for the following:       Result Value   Glucose, Bld 126 (*)    Calcium 8.6 (*)    All other components within normal limits  CBG MONITORING, ED    EKG  EKG Interpretation None       Radiology No results found.  Procedures Procedures (including critical care time)  Medications Ordered in ED Medications  irbesartan (AVAPRO) tablet 150 mg (150 mg Oral Given 06/07/16 1709)   hydrochlorothiazide (MICROZIDE) capsule 12.5 mg (12.5 mg Oral Given 06/07/16 1709)  metoprolol tartrate (LOPRESSOR) tablet 25 mg (25 mg Oral Given 06/07/16 1709)     Initial Impression / Assessment and Plan / ED Course  I have reviewed the triage vital signs and the nursing notes.  Pertinent labs & imaging results that were available during my care of the patient were reviewed by me and considered in my medical decision making (see chart for details).     Her by mouth pain medication. Her kidney function is intact. EKG without changes. No edema. Not hypoxemic. A symptomatically. Recheck BP 184/84. Have asked her to take her metoprolol at night. Her Micardis in the morning. Follow-up pressures at home daily. Primary care follow-up. Don't skip medications.  Final Clinical Impressions(s) / ED Diagnoses   Final diagnoses:  Hypertension, unspecified type    New Prescriptions New Prescriptions   SITAGLIPTIN-METFORMIN (JANUMET) 50-1000 MG TABLET    Take 1 tablet by mouth 2 (two) times daily with a meal.  Rolland Porter, MD 06/07/16 [phone]

## 2016-06-07 NOTE — ED Triage Notes (Signed)
Pt c/o hypertension. She states that she is under a lot a stress. She states that she is on 2 blood pressure medications and has blood clots in both of her legs (and is on blood thinners. She states that she saw her PCP yesterday and he said to follow-up in 8 weeks. A&Ox4. Ambulatory.

## 2016-06-10 ENCOUNTER — Inpatient Hospital Stay (HOSPITAL_COMMUNITY)
Admission: EM | Admit: 2016-06-10 | Discharge: 2016-06-14 | DRG: 254 | Disposition: A | Discharge status: Home / Self Care | Payer: BC Managed Care – PPO | Attending: Family Medicine | Admitting: Family Medicine

## 2016-06-10 ENCOUNTER — Emergency Department (HOSPITAL_COMMUNITY): Payer: BC Managed Care – PPO

## 2016-06-10 ENCOUNTER — Encounter (HOSPITAL_COMMUNITY): Payer: Self-pay | Admitting: Emergency Medicine

## 2016-06-10 DIAGNOSIS — M316 Other giant cell arteritis: Secondary | ICD-10-CM | POA: Diagnosis present

## 2016-06-10 DIAGNOSIS — R51 Headache: Secondary | ICD-10-CM | POA: Diagnosis present

## 2016-06-10 DIAGNOSIS — E1165 Type 2 diabetes mellitus with hyperglycemia: Secondary | ICD-10-CM

## 2016-06-10 DIAGNOSIS — I160 Hypertensive urgency: Secondary | ICD-10-CM | POA: Diagnosis not present

## 2016-06-10 DIAGNOSIS — Z7901 Long term (current) use of anticoagulants: Secondary | ICD-10-CM

## 2016-06-10 DIAGNOSIS — E119 Type 2 diabetes mellitus without complications: Secondary | ICD-10-CM

## 2016-06-10 DIAGNOSIS — R519 Headache, unspecified: Secondary | ICD-10-CM | POA: Diagnosis present

## 2016-06-10 DIAGNOSIS — E785 Hyperlipidemia, unspecified: Secondary | ICD-10-CM | POA: Diagnosis present

## 2016-06-10 DIAGNOSIS — Z86718 Personal history of other venous thrombosis and embolism: Secondary | ICD-10-CM

## 2016-06-10 DIAGNOSIS — I10 Essential (primary) hypertension: Secondary | ICD-10-CM | POA: Diagnosis present

## 2016-06-10 DIAGNOSIS — Z87891 Personal history of nicotine dependence: Secondary | ICD-10-CM

## 2016-06-10 DIAGNOSIS — R197 Diarrhea, unspecified: Secondary | ICD-10-CM | POA: Diagnosis present

## 2016-06-10 DIAGNOSIS — E7800 Pure hypercholesterolemia, unspecified: Secondary | ICD-10-CM | POA: Diagnosis present

## 2016-06-10 DIAGNOSIS — Z7984 Long term (current) use of oral hypoglycemic drugs: Secondary | ICD-10-CM

## 2016-06-10 DIAGNOSIS — Z8249 Family history of ischemic heart disease and other diseases of the circulatory system: Secondary | ICD-10-CM

## 2016-06-10 MED ORDER — HYDRALAZINE HCL 20 MG/ML IJ SOLN
10.0000 mg | Freq: Once | INTRAMUSCULAR | Status: AC
  Administered 2016-06-11: 10 mg via INTRAVENOUS
  Filled 2016-06-10: qty 1

## 2016-06-10 NOTE — ED Provider Notes (Signed)
[facility]-EMERGENCY DEPT Provider Note   CSN: [id number] Arrival date & time: 06/10/16  1713  By signing my name below, I, Elder Negus, attest that this documentation has been prepared under the direction and in the presence of Zabella Wease, Mayer Masker, MD. Electronically Signed: Elder Negus, Scribe. 06/10/16. 11:36 PM.   History   Chief Complaint Chief Complaint  Patient presents with  . Headache    HPI Lori Crane is a 61 y.o. 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 This patient states that in the last 48 hours she has experienced a constant headache which is intolerable. She has experienced frequent headaches in the last two weeks due to, what she attributes to uncontrolled blood pressure. She reports recently that her blood pressures have been greater than 180 systolic. At interview, pain is 7/10 in severity. She is taking Tylenol with partial relief. She does not typically experience headaches. She is otherwise reporting bilateral pedal edema which is an ongoing problem for her. Denies any chest pain or dyspnea.  The history is provided by the patient. No language interpreter was used.    Past Medical History:  Diagnosis Date  . Diabetes mellitus without complication (HCC)   . Hypercholesterolemia   . Hypertension     There are no active problems to display for this patient.   History reviewed. No pertinent surgical history.  OB History    No data available       Home Medications    Prior to Admission medications   Medication Sig Start Date End Date Taking? Authorizing Provider  empagliflozin (JARDIANCE) 25 MG TABS tablet Take 25 mg by mouth daily.  04/30/16  Yes [provider]  gabapentin (NEURONTIN) 300 MG capsule TAKE 1 CAPSULE BY MOUTH TID AS NEEDED FOR PAIN 04/08/16  Yes [provider]  metoprolol (LOPRESSOR) 50 MG tablet Take 50 mg by mouth daily.    Yes  [provider]  rivaroxaban (XARELTO) 20 MG TABS tablet Take 20 mg by mouth daily with supper.  05/23/16 05/23/17 Yes [provider]  simvastatin (ZOCOR) 20 MG tablet Take 20 mg by mouth every evening.    Yes [provider]  sitaGLIPtin-metformin (JANUMET) 50-1000 MG tablet Take 1 tablet by mouth 2 (two) times daily with a meal. 06/07/16  Yes Rolland Porter, MD  telmisartan-hydrochlorothiazide (MICARDIS HCT) 80-25 MG per tablet Take 1 tablet by mouth daily.    Yes [provider]  cephALEXin (KEFLEX) 500 MG capsule Take 1 capsule (500 mg total) by mouth 4 (four) times daily. Patient not taking: Reported on 06/07/2016 05/04/16   Samuel Jester, DO  HYDROcodone-acetaminophen (NORCO) 5-325 MG tablet Take 1 tablet by mouth every 6 (six) hours as needed for severe pain. Patient not taking: Reported on 06/07/2016 02/11/16   Doug Sou, MD  ondansetron (ZOFRAN) 4 MG tablet Take 1 tablet (4 mg total) by mouth every 6 (six) hours. Patient not taking: Reported on 06/07/2016 07/09/13   Jerelyn Scott, MD  oxyCODONE-acetaminophen (PERCOCET/ROXICET) 5-325 MG per tablet Take 1-2 tablets by mouth every 6 (six) hours as needed for severe pain. Patient not taking: Reported on 06/07/2016 07/09/13   Jerelyn Scott, MD  TOUJEO SOLOSTAR 300 UNIT/ML SOPN Inject 40 Units into the skin every morning.  01/23/15   [provider]    Family History History reviewed. No pertinent family history.  Social History Social History  Substance Use Topics  .  Smoking status: Former Games developermoker  . Smokeless tobacco: Former NeurosurgeonUser  . Alcohol use Yes     Comment: socially      Allergies   Metformin and related   Review of Systems Review of Systems  Constitutional: Negative for fever.  Eyes: Negative for photophobia and visual disturbance.  Respiratory: Negative for shortness of breath.   Cardiovascular: Positive for leg swelling. Negative for chest pain.       Uncontrolled blood pressure    Neurological: Positive for headaches.  All other systems reviewed and are negative.    Physical Exam Updated Vital Signs BP (!) 170/89   Pulse 91   Temp 98.8 F (37.1 C) (Oral)   Resp 17   Ht 5\' 6"  (1.676 m)   Wt 189 lb (85.7 kg)   SpO2 100%   BMI 30.51 kg/m   Physical Exam  Constitutional: She is oriented to person, place, and time. She appears well-developed and well-nourished. No distress.  HENT:  Head: Normocephalic and atraumatic.  Eyes: Pupils are equal, round, and reactive to light.  Neck: Neck supple.  Cardiovascular: Normal rate, regular rhythm and normal heart sounds.   Pulmonary/Chest: Effort normal and breath sounds normal. No respiratory distress. She has no wheezes.  Abdominal: Soft. Bowel sounds are normal. There is no tenderness. There is no guarding.  Musculoskeletal: She exhibits edema.  Bilateral pedal edema right slightly greater than left  Neurological: She is alert and oriented to person, place, and time.  5 out of 5 strength in all 4 extremities, no dysmetria to finger-nose-finger, cranial nerves II through XII intact  Skin: Skin is warm and dry.  Psychiatric: She has a normal mood and affect.  Nursing note and vitals reviewed.    ED Treatments / Results  Labs (all labs ordered are listed, but only abnormal results are displayed) Labs Reviewed  CBC WITH DIFFERENTIAL/PLATELET - Abnormal; Notable for the following:       Result Value   RBC 5.32 (*)    All other components within normal limits  BASIC METABOLIC PANEL - Abnormal; Notable for the following:    Glucose, Bld 101 (*)    Calcium 8.8 (*)    All other components within normal limits    EKG  EKG Interpretation None       Radiology Ct Head Wo Contrast  Result Date: 06/10/2016 CLINICAL DATA:  Headache for 4 days. EXAM: CT HEAD WITHOUT CONTRAST TECHNIQUE: Contiguous axial images were obtained from the base of the skull through the vertex without intravenous contrast. COMPARISON:   None. FINDINGS: Brain: Minimal low-density in the periventricular right parietal lobe and scattered throughout the deep white matter is likely chronic small vessel ischemia. No evidence of hemorrhage, territorial infarct hydrocephalus, mass effect or midline shift. No subdural or extra-axial fluid collection. Vascular: Atherosclerosis of skullbase vasculature without hyperdense vessel or abnormal calcification. Skull: Normal. Negative for fracture or focal lesion. Sinuses/Orbits: Minimal debris in the right frontal sinus. Paranasal sinuses are otherwise well-aerated. Mastoid air cells are clear. Visualized orbits are unremarkable. Other: None. IMPRESSION: No acute intracranial abnormality. Mild chronic small vessel ischemia. Electronically Signed   By: Rubye OaksMelanie  Ehinger M.D.   On: 06/10/2016 19:56    Procedures Procedures (including critical care time)  Medications Ordered in ED Medications  prochlorperazine (COMPAZINE) injection 5 mg (not administered)  diphenhydrAMINE (BENADRYL) injection 12.5 mg (not administered)  magnesium sulfate IVPB 2 g 50 mL (not administered)  metoprolol (LOPRESSOR) injection 5 mg (not administered)  hydrALAZINE (APRESOLINE) injection  10 mg (10 mg Intravenous Given 06/11/16 0033)     Initial Impression / Assessment and Plan / ED Course  I have reviewed the triage vital signs and the nursing notes.  Pertinent labs & imaging results that were available during my care of the patient were reviewed by me and considered in my medical decision making (see chart for details).  Clinical Course as of Jun 11 129  Tue Jun 11, 2016  0130 On recheck, patient reports only improvement of headache to 6.5. She remained hypertensive with a systolic in the 180s. She was given a migraine cocktail including magnesium as well as Lopressor. Plan to admit to the hospital for hypertensive urgency.  [CH]    Clinical Course User Index [CH] Zyliah Schier, Mayer Masker, MD    Patient presents with  headache in the setting of recent elevated blood pressures. She reports recently being in the ER with elevated blood pressures. She reports compliance with blood pressure medications. Initial blood pressure 203 systolic. She is nonfocal. CT head negative. She was seen on Friday for elevated blood pressures and has followed up with her primary physician. Patient was given IV hydralazine. Lab work is reassuring. On recheck she has persistent headache and persistent elevated blood pressures. She was dosed with Lopressor and was also given a migraine cocktail including magnesium which should help with her blood pressure. Given that this is her second ER visit and she has persistently elevated blood pressures with symptoms, will admit for hypertensive urgency.    Final Clinical Impressions(s) / ED Diagnoses   Final diagnoses:  Hypertensive urgency  Acute nonintractable headache, unspecified headache type    New Prescriptions New Prescriptions   No medications on file   I personally performed the services described in this documentation, which was scribed in my presence. The recorded information has been reviewed and is accurate.    Shon Baton, MD 06/11/16 [phone]

## 2016-06-10 NOTE — ED Triage Notes (Addendum)
Patient presents with bad HA, tingling in left arm. MD told her to go to ED for HA. Also has blood clots in legs, bilateral LE swelling, right > left. Patient feels "something ain't right". Patient prefers to NOT have labs drawn at this time and will wait, "until I see doctor".

## 2016-06-10 NOTE — ED Notes (Signed)
Nurse starting IV and will draw labs. 

## 2016-06-11 ENCOUNTER — Encounter (HOSPITAL_COMMUNITY): Payer: Self-pay | Admitting: Internal Medicine

## 2016-06-11 DIAGNOSIS — R519 Headache, unspecified: Secondary | ICD-10-CM | POA: Diagnosis present

## 2016-06-11 DIAGNOSIS — Z7901 Long term (current) use of anticoagulants: Secondary | ICD-10-CM | POA: Diagnosis not present

## 2016-06-11 DIAGNOSIS — Z86718 Personal history of other venous thrombosis and embolism: Secondary | ICD-10-CM

## 2016-06-11 DIAGNOSIS — E119 Type 2 diabetes mellitus without complications: Secondary | ICD-10-CM

## 2016-06-11 DIAGNOSIS — I160 Hypertensive urgency: Secondary | ICD-10-CM | POA: Diagnosis present

## 2016-06-11 DIAGNOSIS — R51 Headache: Secondary | ICD-10-CM

## 2016-06-11 DIAGNOSIS — I10 Essential (primary) hypertension: Secondary | ICD-10-CM | POA: Diagnosis present

## 2016-06-11 DIAGNOSIS — E785 Hyperlipidemia, unspecified: Secondary | ICD-10-CM | POA: Diagnosis present

## 2016-06-11 DIAGNOSIS — E7800 Pure hypercholesterolemia, unspecified: Secondary | ICD-10-CM | POA: Diagnosis present

## 2016-06-11 DIAGNOSIS — Z7984 Long term (current) use of oral hypoglycemic drugs: Secondary | ICD-10-CM | POA: Diagnosis not present

## 2016-06-11 DIAGNOSIS — R197 Diarrhea, unspecified: Secondary | ICD-10-CM | POA: Diagnosis present

## 2016-06-11 DIAGNOSIS — E1165 Type 2 diabetes mellitus with hyperglycemia: Secondary | ICD-10-CM | POA: Diagnosis not present

## 2016-06-11 DIAGNOSIS — M316 Other giant cell arteritis: Secondary | ICD-10-CM | POA: Diagnosis present

## 2016-06-11 DIAGNOSIS — Z794 Long term (current) use of insulin: Secondary | ICD-10-CM | POA: Diagnosis not present

## 2016-06-11 DIAGNOSIS — Z87891 Personal history of nicotine dependence: Secondary | ICD-10-CM | POA: Diagnosis not present

## 2016-06-11 DIAGNOSIS — Z8249 Family history of ischemic heart disease and other diseases of the circulatory system: Secondary | ICD-10-CM | POA: Diagnosis not present

## 2016-06-11 LAB — GLUCOSE, CAPILLARY
GLUCOSE-CAPILLARY: 149 mg/dL — AB (ref 65–99)
GLUCOSE-CAPILLARY: 130 mg/dL — AB (ref 65–99)
Glucose-Capillary: 100 mg/dL — ABNORMAL HIGH (ref 65–99)
Glucose-Capillary: 94 mg/dL (ref 65–99)

## 2016-06-11 LAB — BASIC METABOLIC PANEL
ANION GAP: 8 (ref 5–15)
BUN: 9 mg/dL (ref 6–20)
CALCIUM: 8.8 mg/dL — AB (ref 8.9–10.3)
CHLORIDE: 102 mmol/L (ref 101–111)
CO2: 27 mmol/L (ref 22–32)
Creatinine, Ser: 0.8 mg/dL (ref 0.44–1.00)
GFR calc non Af Amer: >60 mL/min (ref >60)
GLUCOSE: 101 mg/dL — AB (ref 65–99)
POTASSIUM: 3.7 mmol/L (ref 3.5–5.1)
Sodium: 137 mmol/L (ref 135–145)

## 2016-06-11 LAB — COMPREHENSIVE METABOLIC PANEL
ALT: 10 U/L — AB (ref 14–54)
AST: 21 U/L (ref 15–41)
Albumin: 1.5 g/dL — ABNORMAL LOW (ref 3.5–5.0)
Alkaline Phosphatase: 56 U/L (ref 38–126)
Anion gap: 9 (ref 5–15)
BILIRUBIN TOTAL: 0.5 mg/dL (ref 0.3–1.2)
BUN: 10 mg/dL (ref 6–20)
CHLORIDE: 104 mmol/L (ref 101–111)
CO2: 24 mmol/L (ref 22–32)
CREATININE: 0.78 mg/dL (ref 0.44–1.00)
Calcium: 8.5 mg/dL — ABNORMAL LOW (ref 8.9–10.3)
GFR calc Af Amer: >60 mL/min (ref >60)
GLUCOSE: 116 mg/dL — AB (ref 65–99)
Potassium: 3.4 mmol/L — ABNORMAL LOW (ref 3.5–5.1)
Sodium: 137 mmol/L (ref 135–145)
Total Protein: 5.4 g/dL — ABNORMAL LOW (ref 6.5–8.1)

## 2016-06-11 LAB — CBC WITH DIFFERENTIAL/PLATELET
BASOS ABS: 0 10*3/uL (ref 0.0–0.1)
BASOS PCT: 0 %
Basophils Absolute: 0 10*3/uL (ref 0.0–0.1)
Basophils Relative: 0 %
EOS ABS: 0.1 10*3/uL (ref 0.0–0.7)
EOS PCT: 1 %
Eosinophils Absolute: 0.1 10*3/uL (ref 0.0–0.7)
Eosinophils Relative: 1 %
HCT: 41.9 % (ref 36.0–46.0)
HEMATOCRIT: 43.8 % (ref 36.0–46.0)
HEMOGLOBIN: 14.5 g/dL (ref 12.0–15.0)
Hemoglobin: 14.2 g/dL (ref 12.0–15.0)
LYMPHS ABS: 2 10*3/uL (ref 0.7–4.0)
LYMPHS PCT: 38 %
Lymphocytes Relative: 26 %
Lymphs Abs: 2.7 10*3/uL (ref 0.7–4.0)
MCH: 27.3 pg (ref 26.0–34.0)
MCH: 27.6 pg (ref 26.0–34.0)
MCHC: 33.1 g/dL (ref 30.0–36.0)
MCHC: 33.9 g/dL (ref 30.0–36.0)
MCV: 81.5 fL (ref 78.0–100.0)
MCV: 82.3 fL (ref 78.0–100.0)
MONO ABS: 0.5 10*3/uL (ref 0.1–1.0)
MONO ABS: 0.7 10*3/uL (ref 0.1–1.0)
Monocytes Relative: 7 %
Monocytes Relative: 9 %
NEUTROS ABS: 3.8 10*3/uL (ref 1.7–7.7)
NEUTROS PCT: 52 %
Neutro Abs: 5.3 10*3/uL (ref 1.7–7.7)
Neutrophils Relative %: 66 %
PLATELETS: 236 10*3/uL (ref 150–400)
Platelets: 255 10*3/uL (ref 150–400)
RBC: 5.14 MIL/uL — AB (ref 3.87–5.11)
RBC: 5.32 MIL/uL — AB (ref 3.87–5.11)
RDW: 13.7 % (ref 11.5–15.5)
RDW: 13.7 % (ref 11.5–15.5)
WBC: 7.3 10*3/uL (ref 4.0–10.5)
WBC: 7.9 10*3/uL (ref 4.0–10.5)

## 2016-06-11 LAB — C-REACTIVE PROTEIN: CRP: <0.8 mg/dL (ref <1.0)

## 2016-06-11 LAB — CK: CK TOTAL: 89 U/L (ref 38–234)

## 2016-06-11 LAB — HIV ANTIBODY (ROUTINE TESTING W REFLEX): HIV SCREEN 4TH GENERATION: NONREACTIVE

## 2016-06-11 LAB — HEPARIN LEVEL (UNFRACTIONATED): Heparin Unfractionated: >2.2 IU/mL — ABNORMAL HIGH (ref 0.30–0.70)

## 2016-06-11 LAB — SEDIMENTATION RATE: Sed Rate: 99 mm/hr — ABNORMAL HIGH (ref 0–22)

## 2016-06-11 LAB — APTT: aPTT: 70 s — ABNORMAL HIGH (ref 24–36)

## 2016-06-11 MED ORDER — ONDANSETRON HCL 4 MG/2ML IJ SOLN: 4.0000 mg | Freq: Four times a day (QID) | INTRAMUSCULAR | Status: DC | PRN

## 2016-06-11 MED ORDER — HYDRALAZINE HCL 20 MG/ML IJ SOLN
10.0000 mg | Freq: Once | INTRAMUSCULAR | Status: AC
  Administered 2016-06-11: 10 mg via INTRAVENOUS
  Filled 2016-06-11: qty 1

## 2016-06-11 MED ORDER — INSULIN GLARGINE 100 UNIT/ML ~~LOC~~ SOLN
40.0000 [IU] | Freq: Every morning | SUBCUTANEOUS | Status: DC
  Administered 2016-06-11 – 2016-06-14 (×4): 40 [IU] via SUBCUTANEOUS
  Filled 2016-06-11 (×4): qty 0.4

## 2016-06-11 MED ORDER — ACETAMINOPHEN 325 MG PO TABS
650.0000 mg | ORAL_TABLET | Freq: Four times a day (QID) | ORAL | Status: DC | PRN
  Administered 2016-06-11 – 2016-06-12 (×2): 650 mg via ORAL
  Filled 2016-06-11 (×2): qty 2

## 2016-06-11 MED ORDER — PROCHLORPERAZINE EDISYLATE 5 MG/ML IJ SOLN
5.0000 mg | Freq: Once | INTRAMUSCULAR | Status: AC
  Administered 2016-06-11: 5 mg via INTRAVENOUS
  Filled 2016-06-11: qty 2

## 2016-06-11 MED ORDER — METOPROLOL TARTRATE 50 MG PO TABS: 50.0000 mg | ORAL_TABLET | Freq: Every day | ORAL | Status: DC

## 2016-06-11 MED ORDER — LORAZEPAM 0.5 MG PO TABS
0.5000 mg | ORAL_TABLET | Freq: Once | ORAL | Status: AC
  Administered 2016-06-11: 0.5 mg via ORAL
  Filled 2016-06-11: qty 1

## 2016-06-11 MED ORDER — IRBESARTAN 300 MG PO TABS
300.0000 mg | ORAL_TABLET | Freq: Every day | ORAL | Status: DC
  Administered 2016-06-11 – 2016-06-14 (×4): 300 mg via ORAL
  Filled 2016-06-11 (×4): qty 1

## 2016-06-11 MED ORDER — HYDRALAZINE HCL 20 MG/ML IJ SOLN: 10.0000 mg | INTRAMUSCULAR | Status: DC | PRN

## 2016-06-11 MED ORDER — CANAGLIFLOZIN 100 MG PO TABS
100.0000 mg | ORAL_TABLET | Freq: Every day | ORAL | Status: DC
  Administered 2016-06-11 – 2016-06-14 (×4): 100 mg via ORAL
  Filled 2016-06-11 (×4): qty 1

## 2016-06-11 MED ORDER — INSULIN ASPART 100 UNIT/ML ~~LOC~~ SOLN
0.0000 [IU] | Freq: Three times a day (TID) | SUBCUTANEOUS | Status: DC
  Administered 2016-06-11 – 2016-06-12 (×2): 2 [IU] via SUBCUTANEOUS
  Administered 2016-06-12: 8 [IU] via SUBCUTANEOUS
  Administered 2016-06-13: 2 [IU] via SUBCUTANEOUS

## 2016-06-11 MED ORDER — SIMVASTATIN 20 MG PO TABS
20.0000 mg | ORAL_TABLET | Freq: Every evening | ORAL | Status: DC
  Administered 2016-06-11 – 2016-06-13 (×3): 20 mg via ORAL
  Filled 2016-06-11 (×3): qty 1

## 2016-06-11 MED ORDER — METOPROLOL TARTRATE 100 MG PO TABS
100.0000 mg | ORAL_TABLET | Freq: Every day | ORAL | Status: DC
  Administered 2016-06-11 – 2016-06-14 (×4): 100 mg via ORAL
  Filled 2016-06-11 (×4): qty 1

## 2016-06-11 MED ORDER — INSULIN ASPART 100 UNIT/ML ~~LOC~~ SOLN: 0.0000 [IU] | Freq: Three times a day (TID) | SUBCUTANEOUS | Status: DC

## 2016-06-11 MED ORDER — RIVAROXABAN 20 MG PO TABS: 20.0000 mg | ORAL_TABLET | Freq: Every day | ORAL | Status: DC

## 2016-06-11 MED ORDER — DIPHENHYDRAMINE HCL 50 MG/ML IJ SOLN
12.5000 mg | Freq: Once | INTRAMUSCULAR | Status: AC
  Administered 2016-06-11: 12.5 mg via INTRAVENOUS
  Filled 2016-06-11: qty 1

## 2016-06-11 MED ORDER — PROCHLORPERAZINE EDISYLATE 5 MG/ML IJ SOLN
10.0000 mg | Freq: Once | INTRAMUSCULAR | Status: AC
  Administered 2016-06-11: 10 mg via INTRAVENOUS
  Filled 2016-06-11: qty 2

## 2016-06-11 MED ORDER — DIPHENHYDRAMINE HCL 50 MG/ML IJ SOLN
25.0000 mg | Freq: Once | INTRAMUSCULAR | Status: AC
  Administered 2016-06-11: 25 mg via INTRAVENOUS
  Filled 2016-06-11: qty 1

## 2016-06-11 MED ORDER — ACETAMINOPHEN 650 MG RE SUPP: 650.0000 mg | Freq: Four times a day (QID) | RECTAL | Status: DC | PRN

## 2016-06-11 MED ORDER — HYDROCHLOROTHIAZIDE 25 MG PO TABS
25.0000 mg | ORAL_TABLET | Freq: Every day | ORAL | Status: DC
  Administered 2016-06-11 – 2016-06-14 (×4): 25 mg via ORAL
  Filled 2016-06-11 (×4): qty 1

## 2016-06-11 MED ORDER — ONDANSETRON HCL 4 MG PO TABS: 4.0000 mg | ORAL_TABLET | Freq: Four times a day (QID) | ORAL | Status: DC | PRN

## 2016-06-11 MED ORDER — LINAGLIPTIN 5 MG PO TABS
5.0000 mg | ORAL_TABLET | Freq: Every day | ORAL | Status: DC
  Administered 2016-06-11 – 2016-06-14 (×3): 5 mg via ORAL
  Filled 2016-06-11 (×3): qty 1

## 2016-06-11 MED ORDER — METOPROLOL TARTRATE 5 MG/5ML IV SOLN
5.0000 mg | Freq: Once | INTRAVENOUS | Status: AC
  Administered 2016-06-11: 5 mg via INTRAVENOUS
  Filled 2016-06-11: qty 5

## 2016-06-11 MED ORDER — AMLODIPINE BESYLATE 10 MG PO TABS
10.0000 mg | ORAL_TABLET | Freq: Every day | ORAL | Status: DC
  Administered 2016-06-12 – 2016-06-14 (×3): 10 mg via ORAL
  Filled 2016-06-11 (×3): qty 1

## 2016-06-11 MED ORDER — TELMISARTAN-HCTZ 80-25 MG PO TABS: 1.0000 | ORAL_TABLET | Freq: Every day | ORAL | Status: DC

## 2016-06-11 MED ORDER — AMLODIPINE BESYLATE 5 MG PO TABS
5.0000 mg | ORAL_TABLET | Freq: Every day | ORAL | Status: DC
  Administered 2016-06-11: 5 mg via ORAL
  Filled 2016-06-11: qty 1

## 2016-06-11 MED ORDER — PREDNISONE 20 MG PO TABS
60.0000 mg | ORAL_TABLET | Freq: Every day | ORAL | Status: DC
  Administered 2016-06-11 – 2016-06-14 (×4): 60 mg via ORAL
  Filled 2016-06-11 (×4): qty 3

## 2016-06-11 MED ORDER — MAGNESIUM SULFATE 2 GM/50ML IV SOLN
2.0000 g | Freq: Once | INTRAVENOUS | Status: AC
  Administered 2016-06-11: 2 g via INTRAVENOUS
  Filled 2016-06-11: qty 50

## 2016-06-11 MED ORDER — HYDRALAZINE HCL 20 MG/ML IJ SOLN
10.0000 mg | INTRAMUSCULAR | Status: DC | PRN
  Administered 2016-06-11 – 2016-06-13 (×5): 10 mg via INTRAVENOUS
  Filled 2016-06-11 (×5): qty 1

## 2016-06-11 MED ORDER — HEPARIN (PORCINE) IN NACL 100-0.45 UNIT/ML-% IJ SOLN
1200.0000 [IU]/h | INTRAMUSCULAR | Status: DC
  Administered 2016-06-11 – 2016-06-12 (×2): 1300 [IU]/h via INTRAVENOUS
  Filled 2016-06-11 (×3): qty 250

## 2016-06-11 NOTE — ED Notes (Signed)
Admitting at bedside 

## 2016-06-11 NOTE — Progress Notes (Signed)
   06/11/16 0945  Clinical Encounter Type  Visited With Patient  Visit Type Other (Comment) ([place] consult)  Spiritual Encounters  Spiritual Needs Sacred text  Stress Factors  Patient Stress Factors None identified  Introduction to Pt. Bible provided.

## 2016-06-11 NOTE — ED Notes (Signed)
Attempted report x1. 

## 2016-06-11 NOTE — Progress Notes (Signed)
Patient admitted after midnight, please see H&P.  Recent DVT diagnosis and is on Xarelto.  Presented with temporal headache (left side) and increased BP.  Sed rate found to be elevated and neurology was consulted who wanted temporal a. Biopsy and prednisone started.  Vascular surgery consult for biopsy if possible.  Marlin CanaryJessica Alaiah Lundy DO

## 2016-06-11 NOTE — H&P (Signed)
History and Physical    Lori Crane ZOX:[id number] DOB: 1955/09/11 DOA: 06/10/2016  PCP: Eliott Nine, MD  Patient coming from: Home.      Chief Complaint: Headache and elevated blood pressure.  HPI: Lori Crane is a 61 y.o. female with history of diabetes mellitus type 2, hypertension, hyperlipidemia and recent diagnosis of lower extremity DVT on Xarelto for the last month presents to the ER with persistent headache. Patient's headache is mostly in the left temporal area with no associated focal deficits or any visual symptoms. Denies any nausea vomiting. Patient has been having persistent diarrhea for last 1 month. Patient states she took some anti-fungal medication for yeast but did not take any antibiotic. Patient also has been noticing increasing blood pressure over the last couple of days. This is her second visit to the ER. Denies any chest pain or shortness of breath. Patient states the headache is more pounding like.  ED Course: In the ER patient was given hydralazine and IV metoprolol for which blood pressure improved. Patient also was given Compazine and Benadryl following which headache improved. But headache started coming back again and patient has been admitted for further observation. CT of the head was unremarkable.  Review of Systems: As per HPI, rest all negative.   Past Medical History:  Diagnosis Date  . Diabetes mellitus without complication (HCC)   . Hypercholesterolemia   . Hypertension     History reviewed. No pertinent surgical history.   reports that she has quit smoking. She has quit using smokeless tobacco. She reports that she drinks alcohol. She reports that she does not use drugs.  Allergies  Allergen Reactions  . Metformin And Related Other (See Comments)    Severe Yeast infection    Family History  Problem Relation Age of Onset  . Hypertension Other     Prior to Admission medications   Medication Sig Start Date End  Date Taking? Authorizing Provider  empagliflozin (JARDIANCE) 25 MG TABS tablet Take 25 mg by mouth daily.  04/30/16  Yes [provider]  gabapentin (NEURONTIN) 300 MG capsule TAKE 1 CAPSULE BY MOUTH TID AS NEEDED FOR PAIN 04/08/16  Yes [provider]  metoprolol (LOPRESSOR) 50 MG tablet Take 50 mg by mouth daily.    Yes [provider]  rivaroxaban (XARELTO) 20 MG TABS tablet Take 20 mg by mouth daily with supper.  05/23/16 05/23/17 Yes [provider]  simvastatin (ZOCOR) 20 MG tablet Take 20 mg by mouth every evening.    Yes [provider]  sitaGLIPtin-metformin (JANUMET) 50-1000 MG tablet Take 1 tablet by mouth 2 (two) times daily with a meal. 06/07/16  Yes Rolland Porter, MD  telmisartan-hydrochlorothiazide (MICARDIS HCT) 80-25 MG per tablet Take 1 tablet by mouth daily.    Yes [provider]  cephALEXin (KEFLEX) 500 MG capsule Take 1 capsule (500 mg total) by mouth 4 (four) times daily. Patient not taking: Reported on 06/07/2016 05/04/16   Samuel Jester, DO  HYDROcodone-acetaminophen (NORCO) 5-325 MG tablet Take 1 tablet by mouth every 6 (six) hours as needed for severe pain. Patient not taking: Reported on 06/07/2016 02/11/16   Doug Sou, MD  ondansetron (ZOFRAN) 4 MG tablet Take 1 tablet (4 mg total) by mouth every 6 (six) hours. Patient not taking: Reported on 06/07/2016 07/09/13   Jerelyn Scott, MD  oxyCODONE-acetaminophen (PERCOCET/ROXICET) 5-325 MG per tablet Take 1-2 tablets by mouth every 6 (six) hours as needed for severe pain. Patient not taking: Reported  on 06/07/2016 07/09/13   Jerelyn ScottLinker, Martha, MD  TOUJEO SOLOSTAR 300 UNIT/ML SOPN Inject 40 Units into the skin every morning.  01/23/15   [provider]    Physical Exam: Vitals:   06/11/16 0300 06/11/16 0315 06/11/16 0330 06/11/16 0345  BP: 138/85 (!) 154/76 137/76 139/75  Pulse: 82 81 80 80  Resp:      Temp:      TempSrc:      SpO2: 99% 96% 96% 96%  Weight:      Height:           Constitutional: Moderately built and nourished. Vitals:   06/11/16 0300 06/11/16 0315 06/11/16 0330 06/11/16 0345  BP: 138/85 (!) 154/76 137/76 139/75  Pulse: 82 81 80 80  Resp:      Temp:      TempSrc:      SpO2: 99% 96% 96% 96%  Weight:      Height:       Eyes: Anicteric. No pallor. ENMT: No discharge from the ears eyes nose or mouth. Neck: No mass felt. No neck rigidity. Respiratory: No rhonchi or crepitations. Cardiovascular: S1 and S2 heard no murmurs appreciated. Abdomen: Soft nontender bowel sounds present. No guarding or rigidity. Musculoskeletal: No edema. No joint effusion. Skin: No rash. Skin appears warm. Neurologic: Alert awake oriented to time place and person. Moves all extremities 5 x 5. No facial asymmetry tongue is midline. Psychiatric: Appears normal. Normal affect.   Labs on Admission: I have personally reviewed following labs and imaging studies  CBC:  Recent Labs Lab 06/11/16 0015  WBC 7.3  NEUTROABS 3.8  HGB 14.5  HCT 43.8  MCV 82.3  PLT 255   Basic Metabolic Panel:  Recent Labs Lab 06/07/16 1637 06/11/16 0015  NA 136 137  K 3.9 3.7  CL 101 102  CO2 30 27  GLUCOSE 126* 101*  BUN 15 9  CREATININE 0.64 0.80  CALCIUM 8.6* 8.8*   GFR: Estimated Creatinine Clearance: 82.5 mL/min (by C-G formula based on SCr of 0.8 mg/dL). Liver Function Tests: No results for input(s): AST, ALT, ALKPHOS, BILITOT, PROT, ALBUMIN in the last 168 hours. No results for input(s): LIPASE, AMYLASE in the last 168 hours. No results for input(s): AMMONIA in the last 168 hours. Coagulation Profile: No results for input(s): INR, PROTIME in the last 168 hours. Cardiac Enzymes: No results for input(s): CKTOTAL, CKMB, CKMBINDEX, TROPONINI in the last 168 hours. BNP (last 3 results) No results for input(s): PROBNP in the last 8760 hours. HbA1C: No results for input(s): HGBA1C in the last 72 hours. CBG:  Recent Labs Lab 06/07/16 1532  GLUCAP 86    Lipid Profile: No results for input(s): CHOL, HDL, LDLCALC, TRIG, CHOLHDL, LDLDIRECT in the last 72 hours. Thyroid Function Tests: No results for input(s): TSH, T4TOTAL, FREET4, T3FREE, THYROIDAB in the last 72 hours. Anemia Panel: No results for input(s): VITAMINB12, FOLATE, FERRITIN, TIBC, IRON, RETICCTPCT in the last 72 hours. Urine analysis:    Component Value Date/Time   COLORURINE YELLOW 05/04/2016 2239   APPEARANCEUR HAZY (A) 05/04/2016 2239   LABSPEC 1.016 05/04/2016 2239   PHURINE 6.0 05/04/2016 2239   GLUCOSEU NEGATIVE 05/04/2016 2239   HGBUR SMALL (A) 05/04/2016 2239   BILIRUBINUR NEGATIVE 05/04/2016 2239   KETONESUR NEGATIVE 05/04/2016 2239   PROTEINUR >=300 (A) 05/04/2016 2239   UROBILINOGEN 1.0 07/09/2013 1356   NITRITE NEGATIVE 05/04/2016 2239   LEUKOCYTESUR NEGATIVE 05/04/2016 2239   Sepsis Labs: @LABRCNTIP (procalcitonin:4,lacticidven:4) )No results found for this  or any previous visit (from the past 240 hour(s)).   Radiological Exams on Admission: Ct Head Wo Contrast  Result Date: 06/10/2016 CLINICAL DATA:  Headache for 4 days. EXAM: CT HEAD WITHOUT CONTRAST TECHNIQUE: Contiguous axial images were obtained from the base of the skull through the vertex without intravenous contrast. COMPARISON:  None. FINDINGS: Brain: Minimal low-density in the periventricular right parietal lobe and scattered throughout the deep white matter is likely chronic small vessel ischemia. No evidence of hemorrhage, territorial infarct hydrocephalus, mass effect or midline shift. No subdural or extra-axial fluid collection. Vascular: Atherosclerosis of skullbase vasculature without hyperdense vessel or abnormal calcification. Skull: Normal. Negative for fracture or focal lesion. Sinuses/Orbits: Minimal debris in the right frontal sinus. Paranasal sinuses are otherwise well-aerated. Mastoid air cells are clear. Visualized orbits are unremarkable. Other: None. IMPRESSION: No acute intracranial  abnormality. Mild chronic small vessel ischemia. Electronically Signed   By: Rubye Oaks M.D.   On: 06/10/2016 19:56     Assessment/Plan Principal Problem:   Headache Active Problems:   Uncontrolled hypertension   Diabetes mellitus type 2, controlled (HCC)   History of DVT of lower extremity    1. Hypertensive urgency - will continue patient's home dose of Micardis hydrochlorothiazide and metoprolol. I have added Norvasc 5 mg since patient has been having persistently elevated blood pressure. Patient will be also on when necessary IV hydralazine. 2. Headache - patient's sedimentation rate has come elevated at 99. I have discussed with on-call neurologist Dr. Amada Jupiter. At this time Dr. Amada Jupiter advised temporal artery biopsy and also to start prednisone until biopsy results are available. Prednisone 60 mg by mouth has been added. Closely monitor for any worsening CBG since patient is diabetic. 3. Diarrhea - check stool studies. Denies any abdominal pain or vomiting. 4. History of DVT recently diagnosed on xarelto - since patient may need temporal artery biopsy I have discussed with patient about bridging with heparin. Patient is agreeable to that. Patient will be placed on heparin until biopsy done. 5. Diabetes mellitus type 2 - patient will be on moderate dose sliding scale since patient is on prednisone. We'll continue patient's home dose of Januvia Jardiance and long-acting insulin. 6. Hyperlipidemia on statins.   DVT prophylaxis: Heparin. Code Status: Full code.  Family Communication: Discussed with patient.  Disposition Plan: Home.  Consults called: Discussed the neurologist.  Admission status: Observation.    Eduard Clos MD [facility] Pager [phone].  If 7PM-7AM, please contact night-coverage [email or web address] Password [facility]  06/11/2016, 4:39 AM

## 2016-06-11 NOTE — Progress Notes (Signed)
ANTICOAGULATION CONSULT NOTE - Initial Consult  Pharmacy Consult for heparin Indication: DVT  Allergies  Allergen Reactions  . Metformin And Related Other (See Comments)    Severe Yeast infection    Patient Measurements: Height: 5\' 6"  (167.6 cm) Weight: 190 lb 12.8 oz (86.5 kg) IBW/kg (Calculated) : 59.3 Heparin Dosing Weight: 78 Kg  Vital Signs: Temp: 97.9 F (36.6 C) (05/08 0613) Temp Source: Oral (05/08 0613) BP: 180/94 (05/08 19140613) Pulse Rate: 88 (05/08 0613)  Labs:  Recent Labs  06/11/16 0015 06/11/16 0448  HGB 14.5 14.2  HCT 43.8 41.9  PLT 255 236  CREATININE 0.80 0.78    Estimated Creatinine Clearance: 82.9 mL/min (by C-G formula based on SCr of 0.78 mg/dL).   Medical History: Past Medical History:  Diagnosis Date  . Diabetes mellitus without complication (HCC)   . Hypercholesterolemia   . Hypertension     Medications:  Prescriptions Prior to Admission  Medication Sig Dispense Refill Last Dose  . empagliflozin (JARDIANCE) 25 MG TABS tablet Take 25 mg by mouth daily.    06/10/2016 at Unknown time  . gabapentin (NEURONTIN) 300 MG capsule TAKE 1 CAPSULE BY MOUTH TID AS NEEDED FOR PAIN   06/10/2016 at Unknown time  . metoprolol (LOPRESSOR) 50 MG tablet Take 50 mg by mouth daily.    06/10/2016 at 2100  . rivaroxaban (XARELTO) 20 MG TABS tablet Take 20 mg by mouth daily with supper.    06/10/2016 at 2100  . simvastatin (ZOCOR) 20 MG tablet Take 20 mg by mouth every evening.    06/10/2016 at Unknown time  . sitaGLIPtin-metformin (JANUMET) 50-1000 MG tablet Take 1 tablet by mouth 2 (two) times daily with a meal. 60 tablet 0 06/10/2016 at Unknown time  . telmisartan-hydrochlorothiazide (MICARDIS HCT) 80-25 MG per tablet Take 1 tablet by mouth daily.    06/10/2016 at Unknown time  . cephALEXin (KEFLEX) 500 MG capsule Take 1 capsule (500 mg total) by mouth 4 (four) times daily. (Patient not taking: Reported on 06/07/2016) 40 capsule 0 Completed Course at Unknown time  .  HYDROcodone-acetaminophen (NORCO) 5-325 MG tablet Take 1 tablet by mouth every 6 (six) hours as needed for severe pain. (Patient not taking: Reported on 06/07/2016) 12 tablet 0 Completed Course at Unknown time  . ondansetron (ZOFRAN) 4 MG tablet Take 1 tablet (4 mg total) by mouth every 6 (six) hours. (Patient not taking: Reported on 06/07/2016) 12 tablet 0 Completed Course at Unknown time  . oxyCODONE-acetaminophen (PERCOCET/ROXICET) 5-325 MG per tablet Take 1-2 tablets by mouth every 6 (six) hours as needed for severe pain. (Patient not taking: Reported on 06/07/2016) 15 tablet 0 Completed Course at Unknown time  . TOUJEO SOLOSTAR 300 UNIT/ML SOPN Inject 40 Units into the skin every morning.    06/07/2016 at Unknown time    Assessment: Lori Crane is a 61 y.o. female Hx of BLE DVT on Xarelto (last dose 5/9 @ 2100) who presents to the ED for evaluation of a headache. Baseline CBC stable. aPTT and Heparin level pending. Pharmacy has consulted to start heparin infusion.   Goal of Therapy:  Heparin level 0.3-0.7 units/ml Monitor platelets by anticoagulation protocol: Yes   Plan:  Start heparin infusion tonight at 2100 Start heparin gtt at 1300 units/hr Check anti-Xa level in 8 hours and daily while on heparin Continue to monitor H&H and platelets  Kateryn Marasigan L Selig [place] 06/11/2016,7:07 AM

## 2016-06-12 DIAGNOSIS — Z794 Long term (current) use of insulin: Secondary | ICD-10-CM

## 2016-06-12 DIAGNOSIS — E1165 Type 2 diabetes mellitus with hyperglycemia: Secondary | ICD-10-CM

## 2016-06-12 DIAGNOSIS — R51 Headache: Secondary | ICD-10-CM

## 2016-06-12 DIAGNOSIS — Z86718 Personal history of other venous thrombosis and embolism: Secondary | ICD-10-CM

## 2016-06-12 LAB — CBC
HEMATOCRIT: 44.8 % (ref 36.0–46.0)
Hemoglobin: 14.9 g/dL (ref 12.0–15.0)
MCH: 26.9 pg (ref 26.0–34.0)
MCHC: 33.3 g/dL (ref 30.0–36.0)
MCV: 81 fL (ref 78.0–100.0)
Platelets: 270 10*3/uL (ref 150–400)
RBC: 5.53 MIL/uL — ABNORMAL HIGH (ref 3.87–5.11)
RDW: 13.8 % (ref 11.5–15.5)
WBC: 8.5 10*3/uL (ref 4.0–10.5)

## 2016-06-12 LAB — GLUCOSE, CAPILLARY
GLUCOSE-CAPILLARY: 132 mg/dL — AB (ref 65–99)
Glucose-Capillary: 130 mg/dL — ABNORMAL HIGH (ref 65–99)
Glucose-Capillary: 270 mg/dL — ABNORMAL HIGH (ref 65–99)

## 2016-06-12 LAB — HEPARIN LEVEL (UNFRACTIONATED)
HEPARIN UNFRACTIONATED: 0.11 [IU]/mL — AB (ref 0.30–0.70)
HEPARIN UNFRACTIONATED: 0.17 [IU]/mL — AB (ref 0.30–0.70)
Heparin Unfractionated: 0.58 IU/mL (ref 0.30–0.70)

## 2016-06-12 LAB — APTT: aPTT: 132 seconds — ABNORMAL HIGH (ref 24–36)

## 2016-06-12 MED ORDER — DIPHENHYDRAMINE HCL 50 MG/ML IJ SOLN: 25.0000 mg | Freq: Three times a day (TID) | INTRAMUSCULAR | Status: DC | PRN

## 2016-06-12 MED ORDER — PROCHLORPERAZINE EDISYLATE 5 MG/ML IJ SOLN
10.0000 mg | Freq: Four times a day (QID) | INTRAMUSCULAR | Status: DC | PRN
  Filled 2016-06-12: qty 2

## 2016-06-12 MED ORDER — TRAMADOL HCL 50 MG PO TABS
50.0000 mg | ORAL_TABLET | Freq: Four times a day (QID) | ORAL | Status: DC | PRN
  Administered 2016-06-12: 50 mg via ORAL
  Filled 2016-06-12 (×2): qty 1

## 2016-06-12 NOTE — Consult Note (Signed)
[facility]  Patient name: Lori Crane MRN: [id number] DOB: March 11, 1955 Sex: female  REASON FOR CONSULT: temporal artery biopsy, consult is from Dr. Eliseo Squires  HPI: Lori Crane is a 61 y.o. female, who presents with new onset left sided headache for the past couple weeks. This headache is localized over the left temporal area and is not associated with any visual changes or amaurosis fugax. No jaw claudication or fatigue. She has also had elevated blood pressure over the last week (systolic 616W). Her blood pressure is normally in the systolic 737T and reports compliance with medications. She was recently diagnosed with bilateral DVT (right popliteal and left saphenofemoral junction) on 05/05/16 after presenting with bilateral leg swelling. She was started on Xarelto. Has no prior history of DVT. No family history of DVT. No recent travel, surgeries or immobility. Former smoker. She denies any sudden weakness of one half of her body and aphasia. She reports having diarrhea over the past month but has not had any bowel movements since being admitted yesterday.   Her past medical history in diabetes on oral hypoglycemics and hypertension on micardis and lopressor. Did report twenty pound weight loss when she started invokana for diabetes several months ago, but has since gained weight back after getting switched to the generic medicine. No history of CAD or CVA.  She is on zocor for hyperlipidemia. She is a former smoker.    Past Medical History:  Diagnosis Date  . Diabetes mellitus without complication ([place])   . Hypercholesterolemia   . Hypertension     Family History  Problem Relation Age of Onset  . Hypertension Other     SOCIAL HISTORY: Social History   Social History  . Marital status: Divorced    Spouse name: N/A  . Number of children: N/A  . Years of education: N/A   Occupational History  . Not on file.   Social History Main Topics  .  Smoking status: Former Research scientist (life sciences)  . Smokeless tobacco: Former Systems developer  . Alcohol use Yes     Comment: socially   . Drug use: No  . Sexual activity: Not on file   Other Topics Concern  . Not on file   Social History Narrative  . No narrative on file    Allergies  Allergen Reactions  . Metformin And Related Other (See Comments)    Severe Yeast infection    MEDICATIONS:  Current Facility-Administered Medications  Medication Dose Route Frequency Provider Last Rate Last Dose  . acetaminophen (TYLENOL) tablet 650 mg  650 mg Oral Q6H PRN Rise Patience, MD   650 mg at 06/12/16 0556   Or  . acetaminophen (TYLENOL) suppository 650 mg  650 mg Rectal Q6H PRN Rise Patience, MD      . amLODipine (NORVASC) tablet 10 mg  10 mg Oral Daily Vann, Jessica U, DO      . canagliflozin [facility]) tablet 100 mg  100 mg Oral QAC breakfast Rise Patience, MD   100 mg at 06/12/16 0820  . heparin ADULT infusion 100 units/mL (25000 units/26m sodium chloride 0.45%)  1,300 Units/hr Intravenous Continuous KRise Patience MD 13 mL/hr at 06/11/16 2055 1,300 Units/hr at 06/11/16 2055  . hydrALAZINE (APRESOLINE) injection 10 mg  10 mg Intravenous Q4H PRN KRise Patience MD   10 mg at 06/12/16 00626 . irbesartan (AVAPRO) tablet 300 mg  300 mg Oral Daily KRise Patience MD   300 mg  at 06/11/16 0952   And  . hydrochlorothiazide (HYDRODIURIL) tablet 25 mg  25 mg Oral Daily Rise Patience, MD   25 mg at 06/11/16 6967  . insulin aspart (novoLOG) injection 0-15 Units  0-15 Units Subcutaneous TID WC Rise Patience, MD   2 Units at 06/12/16 [phone]  . insulin glargine (LANTUS) injection 40 Units  40 Units Subcutaneous q morning - 10a Rise Patience, MD   40 Units at 06/11/16 1010  . linagliptin (TRADJENTA) tablet 5 mg  5 mg Oral Daily Rise Patience, MD   5 mg at 06/11/16 1017  . metoprolol (LOPRESSOR) tablet 100 mg  100 mg Oral Daily Eulogio Bear U, DO   100 mg at  06/11/16 5102  . ondansetron (ZOFRAN) tablet 4 mg  4 mg Oral Q6H PRN Rise Patience, MD       Or  . ondansetron [facility]) injection 4 mg  4 mg Intravenous Q6H PRN Rise Patience, MD      . predniSONE (DELTASONE) tablet 60 mg  60 mg Oral Q breakfast Rise Patience, MD   60 mg at 06/12/16 0820  . simvastatin (ZOCOR) tablet 20 mg  20 mg Oral QPM Rise Patience, MD   20 mg at 06/11/16 1716  . traMADol (ULTRAM) tablet 50 mg  50 mg Oral Q6H PRN Gardiner Barefoot, NP   50 mg at 06/12/16 0242    REVIEW OF SYSTEMS:  '[X]'$  denotes positive finding, '[ ]'$  denotes negative finding Cardiac  Comments:  Chest pain or chest pressure:    Shortness of breath upon exertion:    Short of breath when lying flat:    Irregular heart rhythm:        Vascular    Pain in calf, thigh, or hip brought on by ambulation:    Pain in feet at night that wakes you up from your sleep:     Blood clot in your veins: x b/l DVT  Leg swelling:  x       Pulmonary    Oxygen at home:    Productive cough:     Wheezing:         Neurologic    Sudden weakness in arms or legs:     Sudden numbness in arms or legs:     Sudden onset of difficulty speaking or slurred speech:    Temporary loss of vision in one eye:     Problems with dizziness:         Gastrointestinal    Blood in stool:     Vomited blood:         Genitourinary    Burning when urinating:     Blood in urine:        Psychiatric    Major depression:         Hematologic    Bleeding problems:    Problems with blood clotting too easily:        Skin    Rashes or ulcers:        Constitutional    Fever or chills:      PHYSICAL EXAM: Vitals:   06/12/16 0156 06/12/16 0242 06/12/16 0339 06/12/16 0559  BP: (!) 177/109 (!) 173/100 (!) 170/97 (!) 163/82  Pulse: 98 (!) 103 94 93  Resp:   18   Temp:   98.3 F (36.8 C)   TempSrc:   Oral   SpO2:   99%   Weight:  Height:        GENERAL: The patient is a well-nourished female, in  no acute distress. The vital signs are documented above. HEENT: normocephalic, atraumatic, no abnormalities noted.  CARDIAC: There is a regular rate and rhythm. No carotid bruits.  VASCULAR: 2+ radial and dorsalis pedis pulses bilaterally.  PULMONARY: There is good air exchange bilaterally without wheezing or rales. ABDOMEN: Soft and non-tender.  MUSCULOSKELETAL: No obvious leg swelling.  NEUROLOGIC: 5/5 strength upper and lower extremities bilaterally.  SKIN: There are no ulcers or rashes noted. PSYCHIATRIC: The patient has a normal affect.  DATA:  CT head 06/10/16 without any acute abnormalities  MEDICAL ISSUES: New onset left sided headache Hypertensive urgency Bilateral DVT   Neuro exam intact. No visual changes. Has elevated ESR of 99. Started on steroids by primary team. Xarelto held and placed on heparin for recently diagnosed DVT. Plan for temporal artery biopsy this week. Unilateral vs bilateral biopsy per Dr. Oneida Alar who will see patient later today.   Virgina Jock, PA-C [facility] [place]   History and exam details as above. No real prior headache history.  No visual changes.  At this point headaches have actually resolved.  Left temporal artery biopsy by my partner Dr Donzetta Matters.  This will be done with local and sedation so low risk with DVT. She can restart her oral anticoagulation post procedure.  She can be discharged to home post procedure from our standpoint as soon as anticoagulated  NPO p midnight Consent Hold heparin on call to OR.  Ruta Hinds, MD [facility] [place] Office: [phone] Pager: [phone]

## 2016-06-12 NOTE — Progress Notes (Signed)
ANTICOAGULATION CONSULT NOTE  Pharmacy Consult for heparin Indication: DVT  Allergies  Allergen Reactions  . Metformin And Related Other (See Comments)    Severe Yeast infection    Patient Measurements: Height: 5\' 6"  (167.6 cm) Weight: 190 lb 12.8 oz (86.5 kg) IBW/kg (Calculated) : 59.3 Heparin Dosing Weight: 78 Kg  Vital Signs: Temp: 98.5 F (36.9 C) (05/09 1428) Temp Source: Oral (05/09 1428) BP: 175/94 (05/09 1428) Pulse Rate: 85 (05/09 1428)  Labs:  Recent Labs  06/11/16 0015 06/11/16 0448 06/11/16 0657  06/11/16 0741 06/12/16 0445 06/12/16 1057 06/12/16 1656  HGB 14.5 14.2  --   --   --  14.9  --   --   HCT 43.8 41.9  --   --   --  44.8  --   --   PLT 255 236  --   --   --  270  --   --   APTT  --   --   --   --  70* 132*  --   --   HEPARINUNFRC  --   --   --   < > >2.20* 0.58 0.11* 0.17*  CREATININE 0.80 0.78  --   --   --   --   --   --   CKTOTAL  --   --  89  --   --   --   --   --   < > = values in this interval not displayed.  Estimated Creatinine Clearance: 82.9 mL/min (by C-G formula based on SCr of 0.78 mg/dL).   Assessment: 61 y.o. female Hx of BLE DVT on Xarelto (last dose 5/7 @ 2100) who presents to the ED for evaluation of a headache.   Will utilize heparin levels only moving forward as now clinically useful. Level was low earlier today, suspected due to patient bending her arm as IV pump was beeping frequently. Repeat level (no rate change) still low at 0.17 units/mL. Spoke with RN- she stated patient's arm was swollen, but was able to get blood return from IV site and there was no leaking.  Goal of Therapy:  Heparin level 0.3-0.7 units/ml Monitor platelets by anticoagulation protocol: Yes   Plan:  Increase Heparin to 1450 units/hr Daily heparin level and CBC  Ambriana Selway D. Zaria Taha, PharmD, BCPS Clinical Pharmacist Pager: [phone] 06/12/2016 6:03 PM

## 2016-06-12 NOTE — Progress Notes (Signed)
PROGRESS NOTE    Lori Crane  SAY:[id number] DOB: [birth year] DOA: 06/10/2016 PCP: Eliott Nine, MD   Outpatient Specialists:     Brief Narrative:  Lori Crane is a 61 y.o. female with history of diabetes mellitus type 2, hypertension, hyperlipidemia and recent diagnosis of lower extremity DVT on Xarelto for the last month presents to the ER with persistent headache. Patient's headache is mostly in the left temporal area with no associated focal deficits or any visual symptoms. Denies any nausea vomiting. Patient has been having persistent diarrhea for last 1 month. Patient states she took some anti-fungal medication for yeast but did not take any antibiotic. Patient also has been noticing increasing blood pressure over the last couple of days. This is her second visit to the ER. Denies any chest pain or shortness of breath. Patient states the headache is more pounding like.    Assessment & Plan:   Principal Problem:   Headache Active Problems:   Uncontrolled hypertension   Diabetes mellitus type 2, controlled (HCC)   History of DVT of lower extremity   Hypertensive urgency - -continue patient's home dose of Micardis hydrochlorothiazide and metoprolol - added Norvasc  -also on when necessary IV hydralazine.  Headache -  -sedimentation rate: 99.  -on-call neurologist Dr. Amada Jupiter: advised temporal artery biopsy and also to start prednisone until biopsy results are available. Prednisone 60 mg by mouth has been added  Diarrhea  -resolved  History of DVT recently diagnosed on xarelto -bridge with heparin  Diabetes mellitus type 2 -  -continue patient's home meds -SSI  Hyperlipidemia -statins   DVT prophylaxis:  Fully anticoagulated   Code Status: Full Code   Family Communication: At bedside  Disposition Plan:  Home after biopsy?   Consultants:   vascular      Subjective: Still with headache bu much improved -tender on  left temporal region  Objective: Vitals:   06/12/16 0156 06/12/16 0242 06/12/16 0339 06/12/16 0559  BP: (!) 177/109 (!) 173/100 (!) 170/97 (!) 163/82  Pulse: 98 (!) 103 94 93  Resp:   18   Temp:   98.3 F (36.8 C)   TempSrc:   Oral   SpO2:   99%   Weight:      Height:        Intake/Output Summary (Last 24 hours) at 06/12/16 1156 Last data filed at 06/11/16 1300  Gross per 24 hour  Intake              240 ml  Output                0 ml  Net              240 ml   Filed Weights   06/10/16 1800 06/11/16 0613  Weight: 85.7 kg (189 lb) 86.5 kg (190 lb 12.8 oz)    Examination:  General exam: Appears calm and comfortable  Respiratory system: Clear to auscultation. Respiratory effort normal. Cardiovascular system: S1 & S2 heard, RRR. No JVD, murmurs, rubs, gallops or clicks. No pedal edema. Gastrointestinal system: Abdomen is nondistended, soft and nontender. No organomegaly or masses felt. Normal bowel sounds heard. Central nervous system: Alert and oriented. No focal neurological deficits. Skin: left temporal area tender, normal right Psychiatry: Judgement and insight appear normal. Mood & affect appropriate.     Data Reviewed: I have personally reviewed following labs and imaging studies  CBC:  Recent Labs Lab 06/11/16 0015 06/11/16 0448 06/12/16 0445  WBC  7.3 7.9 8.5  NEUTROABS 3.8 5.3  --   HGB 14.5 14.2 14.9  HCT 43.8 41.9 44.8  MCV 82.3 81.5 81.0  PLT 255 236 270   Basic Metabolic Panel:  Recent Labs Lab 06/07/16 1637 06/11/16 0015 06/11/16 0448  NA 136 137 137  K 3.9 3.7 3.4*  CL 101 102 104  CO2 30 27 24   GLUCOSE 126* 101* 116*  BUN 15 9 10   CREATININE 0.64 0.80 0.78  CALCIUM 8.6* 8.8* 8.5*   GFR: Estimated Creatinine Clearance: 82.9 mL/min (by C-G formula based on SCr of 0.78 mg/dL). Liver Function Tests:  Recent Labs Lab 06/11/16 0448  AST 21  ALT 10*  ALKPHOS 56  BILITOT 0.5  PROT 5.4*  ALBUMIN 1.5*   No results for input(s):  LIPASE, AMYLASE in the last 168 hours. No results for input(s): AMMONIA in the last 168 hours. Coagulation Profile: No results for input(s): INR, PROTIME in the last 168 hours. Cardiac Enzymes:  Recent Labs Lab 06/11/16 0657  CKTOTAL 89   BNP (last 3 results) No results for input(s): PROBNP in the last 8760 hours. HbA1C: No results for input(s): HGBA1C in the last 72 hours. CBG:  Recent Labs Lab 06/11/16 0627 06/11/16 1202 06/11/16 1701 06/11/16 2107 06/12/16 0608  GLUCAP 100* 94 149* 130* 130*   Lipid Profile: No results for input(s): CHOL, HDL, LDLCALC, TRIG, CHOLHDL, LDLDIRECT in the last 72 hours. Thyroid Function Tests: No results for input(s): TSH, T4TOTAL, FREET4, T3FREE, THYROIDAB in the last 72 hours. Anemia Panel: No results for input(s): VITAMINB12, FOLATE, FERRITIN, TIBC, IRON, RETICCTPCT in the last 72 hours. Urine analysis:    Component Value Date/Time   COLORURINE YELLOW 05/04/2016 2239   APPEARANCEUR HAZY (A) 05/04/2016 2239   LABSPEC 1.016 05/04/2016 2239   PHURINE 6.0 05/04/2016 2239   GLUCOSEU NEGATIVE 05/04/2016 2239   HGBUR SMALL (A) 05/04/2016 2239   BILIRUBINUR NEGATIVE 05/04/2016 2239   KETONESUR NEGATIVE 05/04/2016 2239   PROTEINUR >=300 (A) 05/04/2016 2239   UROBILINOGEN 1.0 07/09/2013 1356   NITRITE NEGATIVE 05/04/2016 2239   LEUKOCYTESUR NEGATIVE 05/04/2016 2239     )No results found for this or any previous visit (from the past 240 hour(s)).    Anti-infectives    None       Radiology Studies: Ct Head Wo Contrast  Result Date: 06/10/2016 CLINICAL DATA:  Headache for 4 days. EXAM: CT HEAD WITHOUT CONTRAST TECHNIQUE: Contiguous axial images were obtained from the base of the skull through the vertex without intravenous contrast. COMPARISON:  None. FINDINGS: Brain: Minimal low-density in the periventricular right parietal lobe and scattered throughout the deep white matter is likely chronic small vessel ischemia. No evidence of  hemorrhage, territorial infarct hydrocephalus, mass effect or midline shift. No subdural or extra-axial fluid collection. Vascular: Atherosclerosis of skullbase vasculature without hyperdense vessel or abnormal calcification. Skull: Normal. Negative for fracture or focal lesion. Sinuses/Orbits: Minimal debris in the right frontal sinus. Paranasal sinuses are otherwise well-aerated. Mastoid air cells are clear. Visualized orbits are unremarkable. Other: None. IMPRESSION: No acute intracranial abnormality. Mild chronic small vessel ischemia. Electronically Signed   By: Rubye Oaks M.D.   On: 06/10/2016 19:56        Scheduled Meds: . amLODipine  10 mg Oral Daily  . canagliflozin  100 mg Oral QAC breakfast  . irbesartan  300 mg Oral Daily   And  . hydrochlorothiazide  25 mg Oral Daily  . insulin aspart  0-15 Units Subcutaneous TID WC  .  insulin glargine  40 Units Subcutaneous q morning - 10a  . linagliptin  5 mg Oral Daily  . metoprolol  100 mg Oral Daily  . predniSONE  60 mg Oral Q breakfast  . simvastatin  20 mg Oral QPM   Continuous Infusions: . heparin 1,300 Units/hr (06/11/16 2055)     LOS: 1 day    Time spent: 25 min    Taye Cato U Bradie Sangiovanni, DO [facility] Pager [phone]  If 7PM-7AM, please contact night-coverage [email or web address] Password [email or web address] 06/12/2016, 11:56 AM

## 2016-06-12 NOTE — Progress Notes (Addendum)
ANTICOAGULATION CONSULT NOTE  Pharmacy Consult for heparin Indication: DVT  Allergies  Allergen Reactions  . Metformin And Related Other (See Comments)    Severe Yeast infection    Patient Measurements: Height: 5\' 6"  (167.6 cm) Weight: 190 lb 12.8 oz (86.5 kg) IBW/kg (Calculated) : 59.3 Heparin Dosing Weight: 78 Kg  Vital Signs: Temp: 98.3 F (36.8 C) (05/09 0339) Temp Source: Oral (05/09 0339) BP: 163/82 (05/09 0559) Pulse Rate: 93 (05/09 0559)  Labs:  Recent Labs  06/11/16 0015 06/11/16 0448 06/11/16 0657 06/11/16 0741 06/12/16 0445  HGB 14.5 14.2  --   --  14.9  HCT 43.8 41.9  --   --  44.8  PLT 255 236  --   --  270  APTT  --   --   --  70* 132*  HEPARINUNFRC  --   --   --  >2.20* 0.58  CREATININE 0.80 0.78  --   --   --   CKTOTAL  --   --  89  --   --     Estimated Creatinine Clearance: 82.9 mL/min (by C-G formula based on SCr of 0.78 mg/dL).   Medical History: Past Medical History:  Diagnosis Date  . Diabetes mellitus without complication (HCC)   . Hypercholesterolemia   . Hypertension     Medications:  Prescriptions Prior to Admission  Medication Sig Dispense Refill Last Dose  . empagliflozin (JARDIANCE) 25 MG TABS tablet Take 25 mg by mouth daily.    06/10/2016 at Unknown time  . gabapentin (NEURONTIN) 300 MG capsule TAKE 1 CAPSULE BY MOUTH TID AS NEEDED FOR PAIN   06/10/2016 at Unknown time  . metoprolol (LOPRESSOR) 50 MG tablet Take 50 mg by mouth daily.    06/10/2016 at 2100  . rivaroxaban (XARELTO) 20 MG TABS tablet Take 20 mg by mouth daily with supper.    06/10/2016 at 2100  . simvastatin (ZOCOR) 20 MG tablet Take 20 mg by mouth every evening.    06/10/2016 at Unknown time  . sitaGLIPtin-metformin (JANUMET) 50-1000 MG tablet Take 1 tablet by mouth 2 (two) times daily with a meal. 60 tablet 0 06/10/2016 at Unknown time  . telmisartan-hydrochlorothiazide (MICARDIS HCT) 80-25 MG per tablet Take 1 tablet by mouth daily.    06/10/2016 at Unknown time  .  cephALEXin (KEFLEX) 500 MG capsule Take 1 capsule (500 mg total) by mouth 4 (four) times daily. (Patient not taking: Reported on 06/07/2016) 40 capsule 0 Completed Course at Unknown time  . HYDROcodone-acetaminophen (NORCO) 5-325 MG tablet Take 1 tablet by mouth every 6 (six) hours as needed for severe pain. (Patient not taking: Reported on 06/07/2016) 12 tablet 0 Completed Course at Unknown time  . ondansetron (ZOFRAN) 4 MG tablet Take 1 tablet (4 mg total) by mouth every 6 (six) hours. (Patient not taking: Reported on 06/07/2016) 12 tablet 0 Completed Course at Unknown time  . oxyCODONE-acetaminophen (PERCOCET/ROXICET) 5-325 MG per tablet Take 1-2 tablets by mouth every 6 (six) hours as needed for severe pain. (Patient not taking: Reported on 06/07/2016) 15 tablet 0 Completed Course at Unknown time  . TOUJEO SOLOSTAR 300 UNIT/ML SOPN Inject 40 Units into the skin every morning.    06/07/2016 at Unknown time    Assessment: 61 y.o. female Hx of BLE DVT on Xarelto (last dose 5/7 @ 2100) who presents to the ED for evaluation of a headache. Baseline CBC stable. aPTT and Heparin level pending. Pharmacy has consulted to start heparin infusion.  Baseline  heparin level was supratherapeutic out of range while aPTT was elevated. Subsequent heparin level is now therapeutic while aPTT is supratherapeutic. Will continue current rate and recheck in 6 hours.  Goal of Therapy:  Heparin level 0.3-0.7 units/ml Monitor platelets by anticoagulation protocol: Yes   Plan:  Heparin 1300 units/hr Check anti-Xa level in 6 hours and daily while on heparin Continue to monitor H&H and platelets  Arlean Hopping. Newman Pies, PharmD, BCPS Clinical Pharmacist [phone] 06/12/2016,8:02 AM  ADDN: Repeat heparin level is now subtherapeutic at 0.11 on heparin 1300 units/hr. Nurse reports that she has had to go into room several times due to beeping from pump as patient is bending arm and occluding line. Will recheck level in 6 hours, but suspect this  is due to patient bending arm.  Arlean Hopping. Newman Pies, PharmD, BCPS Clinical Pharmacist [phone]

## 2016-06-13 ENCOUNTER — Encounter (HOSPITAL_COMMUNITY)
Admission: EM | Disposition: A | Discharge status: Home / Self Care | Payer: Self-pay | Source: Home / Self Care | Attending: Internal Medicine

## 2016-06-13 ENCOUNTER — Inpatient Hospital Stay (HOSPITAL_COMMUNITY): Payer: BC Managed Care – PPO | Admitting: Anesthesiology

## 2016-06-13 ENCOUNTER — Encounter (HOSPITAL_COMMUNITY): Payer: Self-pay | Admitting: *Deleted

## 2016-06-13 HISTORY — PX: ARTERY BIOPSY: SHX891

## 2016-06-13 LAB — CBC
HCT: 46.7 % — ABNORMAL HIGH (ref 36.0–46.0)
HEMOGLOBIN: 15.6 g/dL — AB (ref 12.0–15.0)
MCH: 27.1 pg (ref 26.0–34.0)
MCHC: 33.4 g/dL (ref 30.0–36.0)
MCV: 81.2 fL (ref 78.0–100.0)
PLATELETS: 314 10*3/uL (ref 150–400)
RBC: 5.75 MIL/uL — ABNORMAL HIGH (ref 3.87–5.11)
RDW: 14 % (ref 11.5–15.5)
WBC: 10.9 10*3/uL — ABNORMAL HIGH (ref 4.0–10.5)

## 2016-06-13 LAB — BASIC METABOLIC PANEL
Anion gap: 7 (ref 5–15)
BUN: 13 mg/dL (ref 6–20)
CALCIUM: 9 mg/dL (ref 8.9–10.3)
CO2: 26 mmol/L (ref 22–32)
Chloride: 103 mmol/L (ref 101–111)
Creatinine, Ser: 0.69 mg/dL (ref 0.44–1.00)
GFR calc Af Amer: >60 mL/min (ref >60)
GLUCOSE: 98 mg/dL (ref 65–99)
POTASSIUM: 3.3 mmol/L — AB (ref 3.5–5.1)
Sodium: 136 mmol/L (ref 135–145)

## 2016-06-13 LAB — GLUCOSE, CAPILLARY
GLUCOSE-CAPILLARY: 85 mg/dL (ref 65–99)
GLUCOSE-CAPILLARY: 122 mg/dL — AB (ref 65–99)
GLUCOSE-CAPILLARY: 144 mg/dL — AB (ref 65–99)
GLUCOSE-CAPILLARY: 131 mg/dL — AB (ref 65–99)
Glucose-Capillary: 131 mg/dL — ABNORMAL HIGH (ref 65–99)

## 2016-06-13 LAB — HEPARIN LEVEL (UNFRACTIONATED)
HEPARIN UNFRACTIONATED: 0.83 [IU]/mL — AB (ref 0.30–0.70)
HEPARIN UNFRACTIONATED: 0.82 [IU]/mL — AB (ref 0.30–0.70)
Heparin Unfractionated: 0.23 IU/mL — ABNORMAL LOW (ref 0.30–0.70)

## 2016-06-13 LAB — SURGICAL PCR SCREEN
MRSA, PCR: NEGATIVE
Staphylococcus aureus: NEGATIVE

## 2016-06-13 SURGERY — BIOPSY TEMPORAL ARTERY
Anesthesia: Monitor Anesthesia Care | Site: Head | Laterality: Left

## 2016-06-13 MED ORDER — LACTATED RINGERS IV SOLN
INTRAVENOUS | Status: DC
  Administered 2016-06-13: 12:00:00 via INTRAVENOUS

## 2016-06-13 MED ORDER — HYDRALAZINE HCL 25 MG PO TABS
25.0000 mg | ORAL_TABLET | Freq: Three times a day (TID) | ORAL | Status: DC
  Administered 2016-06-13 – 2016-06-14 (×3): 25 mg via ORAL
  Filled 2016-06-13 (×3): qty 1

## 2016-06-13 MED ORDER — PROPOFOL 10 MG/ML IV BOLUS
INTRAVENOUS | Status: DC | PRN
  Administered 2016-06-13 (×3): 20 mg via INTRAVENOUS
  Administered 2016-06-13: 10 mg via INTRAVENOUS
  Administered 2016-06-13: 20 mg via INTRAVENOUS
  Administered 2016-06-13 (×2): 30 mg via INTRAVENOUS
  Administered 2016-06-13: 20 mg via INTRAVENOUS
  Administered 2016-06-13: 50 mg via INTRAVENOUS
  Administered 2016-06-13 (×2): 20 mg via INTRAVENOUS

## 2016-06-13 MED ORDER — MIDAZOLAM HCL 5 MG/5ML IJ SOLN
INTRAMUSCULAR | Status: DC | PRN
  Administered 2016-06-13 (×2): 1 mg via INTRAVENOUS

## 2016-06-13 MED ORDER — MIDAZOLAM HCL 2 MG/2ML IJ SOLN
INTRAMUSCULAR | Status: AC
  Filled 2016-06-13: qty 2

## 2016-06-13 MED ORDER — FENTANYL CITRATE (PF) 250 MCG/5ML IJ SOLN
INTRAMUSCULAR | Status: AC
  Filled 2016-06-13: qty 5

## 2016-06-13 MED ORDER — LIDOCAINE-EPINEPHRINE (PF) 1 %-1:200000 IJ SOLN
INTRAMUSCULAR | Status: DC | PRN
  Administered 2016-06-13: 7 mL

## 2016-06-13 MED ORDER — FENTANYL CITRATE (PF) 100 MCG/2ML IJ SOLN
25.0000 ug | INTRAMUSCULAR | Status: DC | PRN
  Administered 2016-06-13 (×2): 25 ug via INTRAVENOUS

## 2016-06-13 MED ORDER — ONDANSETRON HCL 4 MG/2ML IJ SOLN
INTRAMUSCULAR | Status: AC
  Filled 2016-06-13: qty 6

## 2016-06-13 MED ORDER — LACTATED RINGERS IV SOLN
INTRAVENOUS | Status: DC | PRN
  Administered 2016-06-13: 12:00:00 via INTRAVENOUS

## 2016-06-13 MED ORDER — PROPOFOL 10 MG/ML IV BOLUS
INTRAVENOUS | Status: AC
  Filled 2016-06-13: qty 20

## 2016-06-13 MED ORDER — ONDANSETRON HCL 4 MG/2ML IJ SOLN: 4.0000 mg | Freq: Four times a day (QID) | INTRAMUSCULAR | Status: DC | PRN

## 2016-06-13 MED ORDER — PHENYLEPHRINE 40 MCG/ML (10ML) SYRINGE FOR IV PUSH (FOR BLOOD PRESSURE SUPPORT)
PREFILLED_SYRINGE | INTRAVENOUS | Status: AC
  Filled 2016-06-13: qty 20

## 2016-06-13 MED ORDER — CEFAZOLIN SODIUM-DEXTROSE 2-3 GM-% IV SOLR
INTRAVENOUS | Status: DC | PRN
  Administered 2016-06-13: 2 g via INTRAVENOUS

## 2016-06-13 MED ORDER — CEFAZOLIN SODIUM 1 G IJ SOLR
INTRAMUSCULAR | Status: AC
  Filled 2016-06-13: qty 20

## 2016-06-13 MED ORDER — FENTANYL CITRATE (PF) 100 MCG/2ML IJ SOLN
INTRAMUSCULAR | Status: AC
  Administered 2016-06-13: 14:00:00
  Filled 2016-06-13: qty 2

## 2016-06-13 MED ORDER — LIDOCAINE-EPINEPHRINE (PF) 1 %-1:200000 IJ SOLN
INTRAMUSCULAR | Status: AC
  Filled 2016-06-13: qty 30

## 2016-06-13 MED ORDER — OXYCODONE HCL 5 MG PO TABS: 5.0000 mg | ORAL_TABLET | Freq: Once | ORAL | Status: DC | PRN

## 2016-06-13 MED ORDER — FENTANYL CITRATE (PF) 100 MCG/2ML IJ SOLN
INTRAMUSCULAR | Status: DC | PRN
  Administered 2016-06-13 (×2): 25 ug via INTRAVENOUS

## 2016-06-13 MED ORDER — 0.9 % SODIUM CHLORIDE (POUR BTL) OPTIME
TOPICAL | Status: DC | PRN
  Administered 2016-06-13: 1000 mL

## 2016-06-13 MED ORDER — OXYCODONE HCL 5 MG/5ML PO SOLN: 5.0000 mg | Freq: Once | ORAL | Status: DC | PRN

## 2016-06-13 SURGICAL SUPPLY — 38 items
CANISTER SUCT 3000ML PPV (MISCELLANEOUS) ×3 IMPLANT
CLIP TI WIDE RED SMALL 6 (CLIP) ×3 IMPLANT
CONT SPEC 4OZ CLIKSEAL STRL BL (MISCELLANEOUS) ×3 IMPLANT
COTTONBALL LRG STERILE PKG (GAUZE/BANDAGES/DRESSINGS) ×3 IMPLANT
COVER TRANSDUCER ULTRASND GEL (DRAPE) ×3 IMPLANT
DECANTER SPIKE VIAL GLASS SM (MISCELLANEOUS) ×3 IMPLANT
DERMABOND ADVANCED (GAUZE/BANDAGES/DRESSINGS) ×2
DERMABOND ADVANCED .7 DNX12 (GAUZE/BANDAGES/DRESSINGS) ×1 IMPLANT
DRAPE LAPAROTOMY T 102X78X121 (DRAPES) ×3 IMPLANT
ELECT REM PT RETURN 9FT ADLT (ELECTROSURGICAL) ×3
ELECTRODE REM PT RTRN 9FT ADLT (ELECTROSURGICAL) ×1 IMPLANT
GLOVE BIO SURGEON STRL SZ7.5 (GLOVE) ×3 IMPLANT
GLOVE BIOGEL PI IND STRL 6.5 (GLOVE) ×1 IMPLANT
GLOVE BIOGEL PI IND STRL 7.5 (GLOVE) ×2 IMPLANT
GLOVE BIOGEL PI INDICATOR 6.5 (GLOVE) ×2
GLOVE BIOGEL PI INDICATOR 7.5 (GLOVE) ×4
GLOVE ECLIPSE 7.0 STRL STRAW (GLOVE) ×3 IMPLANT
GOWN STRL REUS W/ TWL LRG LVL3 (GOWN DISPOSABLE) ×1 IMPLANT
GOWN STRL REUS W/ TWL XL LVL3 (GOWN DISPOSABLE) ×1 IMPLANT
GOWN STRL REUS W/TWL LRG LVL3 (GOWN DISPOSABLE) ×2
GOWN STRL REUS W/TWL XL LVL3 (GOWN DISPOSABLE) ×2
KIT BASIN OR (CUSTOM PROCEDURE TRAY) ×3 IMPLANT
KIT ROOM TURNOVER OR (KITS) ×3 IMPLANT
LOOP VESSEL MINI RED (MISCELLANEOUS) ×3 IMPLANT
NEEDLE HYPO 25GX1X1/2 BEV (NEEDLE) ×3 IMPLANT
NS IRRIG 1000ML POUR BTL (IV SOLUTION) ×3 IMPLANT
PACK GENERAL/GYN (CUSTOM PROCEDURE TRAY) ×3 IMPLANT
PAD ARMBOARD 7.5X6 YLW CONV (MISCELLANEOUS) ×6 IMPLANT
SUCTION FRAZIER HANDLE 10FR (MISCELLANEOUS) ×2
SUCTION TUBE FRAZIER 10FR DISP (MISCELLANEOUS) ×1 IMPLANT
SUT MNCRL AB 4-0 PS2 18 (SUTURE) ×6 IMPLANT
SUT PROLENE 6 0 BV (SUTURE) IMPLANT
SUT SILK 3 0 (SUTURE) ×2
SUT SILK 3-0 18XBRD TIE 12 (SUTURE) ×1 IMPLANT
SUT VIC AB 3-0 SH 27 (SUTURE) ×2
SUT VIC AB 3-0 SH 27X BRD (SUTURE) ×1 IMPLANT
SYR CONTROL 10ML LL (SYRINGE) ×3 IMPLANT
WATER STERILE IRR 1000ML POUR (IV SOLUTION) ×3 IMPLANT

## 2016-06-13 NOTE — Anesthesia Preprocedure Evaluation (Addendum)
Anesthesia Evaluation  Patient identified by MRN, date of birth, ID band Patient awake    Reviewed: Allergy & Precautions, H&P , NPO status , Patient's Chart, lab work & pertinent test results  Airway Mallampati: II   Neck ROM: full    Dental   Pulmonary former smoker,    breath sounds clear to auscultation       Cardiovascular hypertension, On Medications  Rhythm:regular Rate:Normal     Neuro/Psych  Headaches,    GI/Hepatic   Endo/Other  diabetes, Type 2  Renal/GU      Musculoskeletal   Abdominal   Peds  Hematology   Anesthesia Other Findings   Reproductive/Obstetrics                            Anesthesia Physical Anesthesia Plan  ASA: II  Anesthesia Plan: MAC   Post-op Pain Management:    Induction: Intravenous  Airway Management Planned: Simple Face Mask  Additional Equipment:   Intra-op Plan:   Post-operative Plan:   Informed Consent: I have reviewed the patients History and Physical, chart, labs and discussed the procedure including the risks, benefits and alternatives for the proposed anesthesia with the patient or authorized representative who has indicated his/her understanding and acceptance.     Plan Discussed with: CRNA, Anesthesiologist and Surgeon  Anesthesia Plan Comments:         Anesthesia Quick Evaluation

## 2016-06-13 NOTE — Progress Notes (Signed)
  Progress Note    06/13/2016 12:16 PM Day of Surgery  Subjective:  No acute issues  Vitals:   06/13/16 0918 06/13/16 1022  BP: (!) 161/94 (!) 183/94  Pulse:  71  Resp: 18   Temp:      Physical Exam: aaox3 Non labored respirations rrr Palpable left temporal artery  CBC    Component Value Date/Time   WBC 10.9 (H) 06/13/2016 0231   RBC 5.75 (H) 06/13/2016 0231   HGB 15.6 (H) 06/13/2016 0231   HCT 46.7 (H) 06/13/2016 0231   PLT 314 06/13/2016 0231   MCV 81.2 06/13/2016 0231   MCH 27.1 06/13/2016 0231   MCHC 33.4 06/13/2016 0231   RDW 14.0 06/13/2016 0231   LYMPHSABS 2.0 06/11/2016 0448   MONOABS 0.5 06/11/2016 0448   EOSABS 0.1 06/11/2016 0448   BASOSABS 0.0 06/11/2016 0448    BMET    Component Value Date/Time   NA 136 06/13/2016 0231   K 3.3 (L) 06/13/2016 0231   CL 103 06/13/2016 0231   CO2 26 06/13/2016 0231   GLUCOSE 98 06/13/2016 0231   BUN 13 06/13/2016 0231   CREATININE 0.69 06/13/2016 0231   CALCIUM 9.0 06/13/2016 0231   GFRNONAA >60 06/13/2016 0231   GFRAA >60 06/13/2016 0231    INR    Component Value Date/Time   INR 1.0 07/13/2008 1732     Intake/Output Summary (Last 24 hours) at 06/13/16 1216 Last data filed at 06/13/16 0500  Gross per 24 hour  Intake           431.18 ml  Output                0 ml  Net           431.18 ml     Assessment:  61 y.o. female is here with new onset headache and hypertensive urgency concerning for gca.   Plan: Temporal artery biopsy on left today in OR Discussed risks and benefits and she agrees to proceed Will be ok for discharge when anticoaulated.   Enes Rokosz C. Randie Heinzain, MD [facility] [place] Office: [phone] Pager: [phone]  06/13/2016 12:16 PM

## 2016-06-13 NOTE — Progress Notes (Signed)
ANTICOAGULATION CONSULT NOTE - Follow Up Consult  Pharmacy Consult for Heparin Indication: DVT  Allergies  Allergen Reactions  . Metformin And Related Other (See Comments)    Severe Yeast infection    Patient Measurements: Height: 5\' 6"  (167.6 cm) Weight: 190 lb 12.8 oz (86.5 kg) IBW/kg (Calculated) : 59.3 Heparin Dosing Weight:    Vital Signs: Temp: 97.6 F (36.4 C) (05/10 1541) BP: 163/80 (05/10 1541) Pulse Rate: 82 (05/10 1541)  Labs:  Recent Labs  06/11/16 0015 06/11/16 0448 06/11/16 0657  06/11/16 0741 06/12/16 0445  06/12/16 1656 06/13/16 0231 06/13/16 1122  HGB 14.5 14.2  --   --   --  14.9  --   --  15.6*  --   HCT 43.8 41.9  --   --   --  44.8  --   --  46.7*  --   PLT 255 236  --   --   --  270  --   --  314  --   APTT  --   --   --   --  70* 132*  --   --   --   --   HEPARINUNFRC  --   --   --   < > >2.20* 0.58  < > 0.17* 0.83* 0.82*  CREATININE 0.80 0.78  --   --   --   --   --   --  0.69  --   CKTOTAL  --   --  89  --   --   --   --   --   --   --   < > = values in this interval not displayed.  Estimated Creatinine Clearance: 82.9 mL/min (by C-G formula based on SCr of 0.69 mg/dL).   Assessment:  Anticoag: Xarelto PTA for DVT- last dose taken 5/7 at 2100. Heparin to start at 2100 on 5/8. CBC good. HL ).17>0.83>0.82. - RN reports heparin was turned off around 1100 prior to temporal artery biopsy.   Goal of Therapy:  Heparin level 0.3-0.7 units/ml Monitor platelets by anticoagulation protocol: Yes   Plan:  s/p Temporal artery biopsy today Resume IV heparin now (per Dr. Randie Heinzain, 1600) at reduced 1100 units/hr Check HL in 6 hr. Daily HL and CBC  Lori Crane, Lori Crane, Lori Crane Clinical Staff Pharmacist Pager [phone]  Lori Crane, Lori Crane 06/13/2016,4:00 PM

## 2016-06-13 NOTE — Transfer of Care (Signed)
Immediate Anesthesia Transfer of Care Note  Patient: [name]  Procedure(s) Performed: Procedure(s): LEFT TEMPORAL ARTERY BIOPSY (Left)  Patient Location: PACU  Anesthesia Type:MAC  Level of Consciousness: awake, alert  and oriented  Airway & Oxygen Therapy: Patient Spontanous Breathing and Patient connected to nasal cannula oxygen  Post-op Assessment: Report given to RN, Post -op Vital signs reviewed and stable and Patient moving all extremities X 4  Post vital signs: Reviewed and stable  Last Vitals:  Vitals:   06/13/16 0918 06/13/16 1022  BP: (!) 161/94 (!) 183/94  Pulse:  71  Resp: 18   Temp:      Last Pain:  Vitals:   06/13/16 0741  TempSrc:   PainSc: 0-No pain      Patients Stated Pain Goal: 0 (69/62/95 2841)  Complications: No apparent anesthesia complications

## 2016-06-13 NOTE — Op Note (Signed)
    Patient name: Lori NakayamaMadelyn D Crane MRN: [id number] DOB: Jul 30, 1955 Sex: female  06/10/2016 - 06/13/2016 Pre-operative Diagnosis: left sided headache, dvt Post-operative diagnosis:  Same Surgeon:  Luanna SalkBrandon C. Randie Heinzain, MD Procedure Performed: Left temporal artery biopsy  Indications:  61 year old female admitted with new onset left-sided headache and hypertension. She's been worked up and is indicated for temporal artery biopsy.    Procedure:  The patient was identified in the holding area and taken to the operating room where she was placed supine and back anesthesia induced. She was given antibiotics and Tylenol cold. His ultrasound to identify the temporal artery and then instilled 1% lidocaine with epinephrine 7 mL overlying the temporal artery. An incision was made and carried down through the skin and subcutaneous tissue to the level of the artery this was dissected free. I confirmed arterial flow with Doppler. I then dissected the artery out proximal and distally for a total of 4.5 cm and this was divided between ties proximal and distally and sent as a specimen. Hemostasis obtained the wound was irrigated and closed in 2 layers with 3-0 Vicryl for Monocryl and Dermabond was placed at the level of the skin. All counts were correct and patient tolerated procedure well without immediate complications.    Ajah Vanhoose C. Randie Heinzain, MD [facility] [place] Office: [phone] Pager: [phone]

## 2016-06-13 NOTE — Progress Notes (Signed)
PROGRESS NOTE    Lori Crane  UJW:[id number] DOB: 1956-01-13 DOA: 06/10/2016 PCP: Eliott NineLeague-Sobon, Jennifer, MD   Outpatient Specialists:     Brief Narrative:  Lori StallMadelyn D Aguayo is a 61 y.o. female with history of diabetes mellitus type 2, hypertension, hyperlipidemia and recent diagnosis of lower extremity DVT on Xarelto for the last month presents to the ER with persistent headache was also found ot have hypertensive urgency.     Assessment & Plan:   Principal Problem:   Headache Active Problems:   Uncontrolled hypertension   Diabetes mellitus type 2, controlled (HCC)   History of DVT of lower extremity   Hypertensive urgency - -continue patient's home dose of Micardis hydrochlorothiazide and metoprolol - added Norvasc and hydralazine po, added IV hydralazine.   Headache -  -sedimentation rate: 99.  -on-call neurologist Dr. Amada JupiterKirkpatrick: advised temporal artery biopsy and also to start prednisone until biopsy results are available. Prednisone 60 mg by mouth has been added. Vascular consulted and plan for tenporal artery biopsy.   Diarrhea  -resolved  History of DVT recently diagnosed on xarelto -bridge with heparin, restart xarelto once biopsy is done.   Diabetes mellitus type 2 -  -continue patient's home meds -SSI CBG (last 3)   Recent Labs  06/13/16 0655 06/13/16 1058 06/13/16 1404  GLUCAP 85 122* 131*    cbgs well controlled, watch while on prednisone.   Hyperlipidemia -statins   DVT prophylaxis:  Fully anticoagulated   Code Status: Full Code   Family Communication: None at bedside.   Disposition Plan:  Home after biopsy?   Consultants:   vascular      Subjective: Headache improved.   Objective: Vitals:   06/13/16 1353 06/13/16 1400 06/13/16 1407 06/13/16 1415  BP:   (!) 167/91   Pulse: 82 66 73 70  Resp: 11 13 14 15   Temp: 97.9 F (36.6 C)     TempSrc:      SpO2: 99% 100% 100% 100%  Weight:      Height:         Intake/Output Summary (Last 24 hours) at 06/13/16 1433 Last data filed at 06/13/16 1353  Gross per 24 hour  Intake          1481.18 ml  Output                2 ml  Net          1479.18 ml   Filed Weights   06/10/16 1800 06/11/16 0613 06/13/16 1156  Weight: 85.7 kg (189 lb) 86.5 kg (190 lb 12.8 oz) 86.5 kg (190 lb 12.8 oz)    Examination:  General exam: Appears calm and comfortable  Respiratory system: Clear to auscultation. Respiratory effort normal. Cardiovascular system: S1 & S2 heard, RRR. No JVD, murmurs, rubs, gallops or clicks. No pedal edema. Gastrointestinal system: Abdomen is nondistended, soft and nontender. No organomegaly or masses felt. Normal bowel sounds heard. Central nervous system: Alert and oriented. No focal neurological deficits. Skin: left temporal area tender, normal right Psychiatry: Judgement and insight appear normal. Mood & affect appropriate.     Data Reviewed: I have personally reviewed following labs and imaging studies  CBC:  Recent Labs Lab 06/11/16 0015 06/11/16 0448 06/12/16 0445 06/13/16 0231  WBC 7.3 7.9 8.5 10.9*  NEUTROABS 3.8 5.3  --   --   HGB 14.5 14.2 14.9 15.6*  HCT 43.8 41.9 44.8 46.7*  MCV 82.3 81.5 81.0 81.2  PLT 255 236 270 314  Basic Metabolic Panel:  Recent Labs Lab 06/07/16 1637 06/11/16 0015 06/11/16 0448 06/13/16 0231  NA 136 137 137 136  K 3.9 3.7 3.4* 3.3*  CL 101 102 104 103  CO2 30 27 24 26   GLUCOSE 126* 101* 116* 98  BUN 15 9 10 13   CREATININE 0.64 0.80 0.78 0.69  CALCIUM 8.6* 8.8* 8.5* 9.0   GFR: Estimated Creatinine Clearance: 82.9 mL/min (by C-G formula based on SCr of 0.69 mg/dL). Liver Function Tests:  Recent Labs Lab 06/11/16 0448  AST 21  ALT 10*  ALKPHOS 56  BILITOT 0.5  PROT 5.4*  ALBUMIN 1.5*   No results for input(s): LIPASE, AMYLASE in the last 168 hours. No results for input(s): AMMONIA in the last 168 hours. Coagulation Profile: No results for input(s): INR, PROTIME  in the last 168 hours. Cardiac Enzymes:  Recent Labs Lab 06/11/16 0657  CKTOTAL 89   BNP (last 3 results) No results for input(s): PROBNP in the last 8760 hours. HbA1C: No results for input(s): HGBA1C in the last 72 hours. CBG:  Recent Labs Lab 06/12/16 1629 06/12/16 2053 06/13/16 0655 06/13/16 1058 06/13/16 1404  GLUCAP 270* 132* 85 122* 131*   Lipid Profile: No results for input(s): CHOL, HDL, LDLCALC, TRIG, CHOLHDL, LDLDIRECT in the last 72 hours. Thyroid Function Tests: No results for input(s): TSH, T4TOTAL, FREET4, T3FREE, THYROIDAB in the last 72 hours. Anemia Panel: No results for input(s): VITAMINB12, FOLATE, FERRITIN, TIBC, IRON, RETICCTPCT in the last 72 hours. Urine analysis:    Component Value Date/Time   COLORURINE YELLOW 05/04/2016 2239   APPEARANCEUR HAZY (A) 05/04/2016 2239   LABSPEC 1.016 05/04/2016 2239   PHURINE 6.0 05/04/2016 2239   GLUCOSEU NEGATIVE 05/04/2016 2239   HGBUR SMALL (A) 05/04/2016 2239   BILIRUBINUR NEGATIVE 05/04/2016 2239   KETONESUR NEGATIVE 05/04/2016 2239   PROTEINUR >=300 (A) 05/04/2016 2239   UROBILINOGEN 1.0 07/09/2013 1356   NITRITE NEGATIVE 05/04/2016 2239   LEUKOCYTESUR NEGATIVE 05/04/2016 2239     ) Recent Results (from the past 240 hour(s))  Surgical PCR screen     Status: None   Collection Time: 06/13/16 11:37 AM  Result Value Ref Range Status   MRSA, PCR NEGATIVE NEGATIVE Final   Staphylococcus aureus NEGATIVE NEGATIVE Final    Comment:        The Xpert SA Assay (FDA approved for NASAL specimens in patients over 62 years of age), is one component of a comprehensive surveillance program.  Test performance has been validated by [facility] for patients greater than or equal to 76 year old. It is not intended to diagnose infection nor to guide or monitor treatment.       Anti-infectives    None       Radiology Studies: No results found.      Scheduled Meds: . [MAR Hold] amLODipine  10 mg  Oral Daily  . [MAR Hold] canagliflozin  100 mg Oral QAC breakfast  . fentaNYL      . [MAR Hold] irbesartan  300 mg Oral Daily   And  . [MAR Hold] hydrochlorothiazide  25 mg Oral Daily  . [MAR Hold] insulin aspart  0-15 Units Subcutaneous TID WC  . [MAR Hold] insulin glargine  40 Units Subcutaneous q morning - 10a  . [MAR Hold] linagliptin  5 mg Oral Daily  . [MAR Hold] metoprolol  100 mg Oral Daily  . [MAR Hold] predniSONE  60 mg Oral Q breakfast  . [MAR Hold] simvastatin  20  mg Oral QPM   Continuous Infusions: . heparin Stopped (06/13/16 1134)  . lactated ringers 10 mL/hr at 06/13/16 1206     LOS: 2 days    Time spent: 25 min    Kersti Scavone, DO [facility] Pager [phone]  If 7PM-7AM, please contact night-coverage [email or web address] Password [facility] 06/13/2016, 2:33 PM

## 2016-06-13 NOTE — Progress Notes (Signed)
Patient with elevated BP all morning today and has denied any chest pain, headache, shortness of breath, or vision changes. Was first given prn dose of IV hydralazine per orders at 0540 by night shift with little effect on BP. I gave patient all her ordered antihypertensives and beta blocker early at 0745. By 1022, BP was once again elevated. Paged Dr. Blake DivineAkula and gave another dose of IV hydralazine per orders. Will continue to monitor.   Vitals:   06/13/16 0618 06/13/16 0741 06/13/16 0918 06/13/16 1022  BP: (!) 178/88 (!) 176/100 (!) 161/94 (!) 183/94  Pulse: 98   71  Resp:   18   Temp:      TempSrc:      SpO2:   99%   Weight:      Height:

## 2016-06-13 NOTE — Progress Notes (Signed)
ANTICOAGULATION CONSULT NOTE - Follow Up Consult  Pharmacy Consult for Heparin (Xarelto on hold) Indication: DVT  Allergies  Allergen Reactions  . Metformin And Related Other (See Comments)    Severe Yeast infection    Patient Measurements: Height: 5\' 6"  (167.6 cm) Weight: 190 lb 12.8 oz (86.5 kg) IBW/kg (Calculated) : 59.3  Vital Signs: Temp: 97.9 F (36.6 C) (05/09 1935) Temp Source: Oral (05/09 1935) BP: 121/68 (05/09 1935) Pulse Rate: 89 (05/09 1935)  Labs:  Recent Labs  06/11/16 0015 06/11/16 0448 06/11/16 0657  06/11/16 0741 06/12/16 0445 06/12/16 1057 06/12/16 1656 06/13/16 0231  HGB 14.5 14.2  --   --   --  14.9  --   --  15.6*  HCT 43.8 41.9  --   --   --  44.8  --   --  46.7*  PLT 255 236  --   --   --  270  --   --  314  APTT  --   --   --   --  70* 132*  --   --   --   HEPARINUNFRC  --   --   --   < > >2.20* 0.58 0.11* 0.17* 0.83*  CREATININE 0.80 0.78  --   --   --   --   --   --  0.69  CKTOTAL  --   --  89  --   --   --   --   --   --   < > = values in this interval not displayed.  Estimated Creatinine Clearance: 82.9 mL/min (by C-G formula based on SCr of 0.69 mg/dL).    Assessment: Heparin while xarelto on hold, heparin level is elevated this AM, no issues per RN  Goal of Therapy:  Heparin level 0.3-0.7 units/ml Monitor platelets by anticoagulation protocol: Yes   Plan:  -Dec heparin to 1300 units/hr -1200 HL  Lori Crane, Lori Crane 06/13/2016,3:23 AM

## 2016-06-13 NOTE — Anesthesia Postprocedure Evaluation (Signed)
Anesthesia Post Note  Patient: Lori Crane  Procedure(s) Performed: Procedure(s) (LRB): LEFT TEMPORAL ARTERY BIOPSY (Left)  Patient location during evaluation: PACU Anesthesia Type: MAC Level of consciousness: awake and alert Pain management: pain level controlled Vital Signs Assessment: post-procedure vital signs reviewed and stable Respiratory status: spontaneous breathing, nonlabored ventilation, respiratory function stable and patient connected to nasal cannula oxygen Cardiovascular status: stable and blood pressure returned to baseline Anesthetic complications: no       Last Vitals:  Vitals:   06/13/16 1407 06/13/16 1415  BP: (!) 167/91   Pulse: 73 70  Resp: 14 15  Temp:      Last Pain:  Vitals:   06/13/16 1415  TempSrc:   PainSc: [place]

## 2016-06-13 NOTE — Care Management Note (Signed)
Case Management Note  Patient Details  Name: Daiva NakayamaMadelyn D Zajicek MRN: [id number] Date of Birth: 03-14-1955  Subjective/Objective:                 Patient from home alone, son supportive. [place] Xaralto pta for DVT, to have bx of temporal artery, c/o HA, uncontrolled HTN.  PCP LEAGUE-SOBON, JENNIFER     Action/Plan:  CM will continue to follow for DC planning.  Expected Discharge Date:                  Expected Discharge Plan:  Home w Home Health Services  In-House Referral:     Discharge planning Services  CM Consult  Post Acute Care Choice:    Choice offered to:     DME Arranged:    DME Agency:     HH Arranged:    HH Agency:     Status of Service:  In process, will continue to follow  If discussed at Long Length of Stay Meetings, dates discussed:    Additional Comments:  Lawerance SabalDebbie Peggy Loge, RN 06/13/2016, 5:07 PM

## 2016-06-13 NOTE — Progress Notes (Signed)
ANTICOAGULATION CONSULT NOTE - Follow Up Consult  Pharmacy Consult for Heparin (Xarelto on hold) Indication: DVT  Allergies  Allergen Reactions  . Metformin And Related Other (See Comments)    Severe Yeast infection   Patient Measurements: Height: 5\' 6"  (167.6 cm) Weight: 190 lb 12.8 oz (86.5 kg) IBW/kg (Calculated) : 59.3  Vital Signs: Temp: 97.9 F (36.6 C) (05/10 1930) Temp Source: Oral (05/10 1930) BP: 155/86 (05/10 2137) Pulse Rate: 93 (05/10 1930)  Labs:  Recent Labs  06/11/16 0015 06/11/16 0448 06/11/16 [id number]  06/11/16 0741 06/12/16 0445  06/13/16 0231 06/13/16 1122 06/13/16 2221  HGB 14.5 14.2  --   --   --  14.9  --  15.6*  --   --   HCT 43.8 41.9  --   --   --  44.8  --  46.7*  --   --   PLT 255 236  --   --   --  270  --  314  --   --   APTT  --   --   --   --  70* 132*  --   --   --   --   HEPARINUNFRC  --   --   --   < > >2.20* 0.58  < > 0.83* 0.82* 0.23*  CREATININE 0.80 0.78  --   --   --   --   --  0.69  --   --   CKTOTAL  --   --  89  --   --   --   --   --   --   --   < > = values in this interval not displayed.  Estimated Creatinine Clearance: 82.9 mL/min (by C-G formula based on SCr of 0.69 mg/dL).  Assessment: Heparin while xarelto on hold, heparin level sub-therapeutic after re-start s/p OR, no issues per RN.   Goal of Therapy:  Heparin level 0.3-0.7 units/ml Monitor platelets by anticoagulation protocol: Yes   Plan:  -Inc heparin to 1200 units/hr -0800 HL  Abran DukeLedford, Jaquez Farrington 06/13/2016,11:25 PM

## 2016-06-14 ENCOUNTER — Encounter (HOSPITAL_COMMUNITY): Payer: Self-pay | Admitting: Vascular Surgery

## 2016-06-14 DIAGNOSIS — M316 Other giant cell arteritis: Secondary | ICD-10-CM | POA: Diagnosis present

## 2016-06-14 LAB — CBC
HCT: 44.7 % (ref 36.0–46.0)
Hemoglobin: 15.2 g/dL — ABNORMAL HIGH (ref 12.0–15.0)
MCH: 28 pg (ref 26.0–34.0)
MCHC: 34 g/dL (ref 30.0–36.0)
MCV: 82.3 fL (ref 78.0–100.0)
PLATELETS: 302 10*3/uL (ref 150–400)
RBC: 5.43 MIL/uL — AB (ref 3.87–5.11)
RDW: 14.2 % (ref 11.5–15.5)
WBC: 8.8 10*3/uL (ref 4.0–10.5)

## 2016-06-14 LAB — GLUCOSE, CAPILLARY
Glucose-Capillary: 60 mg/dL — ABNORMAL LOW (ref 65–99)
Glucose-Capillary: 92 mg/dL (ref 65–99)

## 2016-06-14 MED ORDER — PREDNISONE 20 MG PO TABS: 60.0000 mg | ORAL_TABLET | Freq: Every day | ORAL | 0 refills | Status: DC

## 2016-06-14 MED ORDER — RIVAROXABAN 20 MG PO TABS
20.0000 mg | ORAL_TABLET | Freq: Every day | ORAL | Status: DC
  Administered 2016-06-14: 20 mg via ORAL
  Filled 2016-06-14: qty 1

## 2016-06-14 MED ORDER — HYDRALAZINE HCL 50 MG PO TABS: 50.0000 mg | ORAL_TABLET | Freq: Three times a day (TID) | ORAL | Status: DC

## 2016-06-14 MED ORDER — HYDRALAZINE HCL 50 MG PO TABS: 50.0000 mg | ORAL_TABLET | Freq: Three times a day (TID) | ORAL | 0 refills | Status: DC

## 2016-06-14 MED ORDER — METOPROLOL TARTRATE 50 MG PO TABS: 50.0000 mg | ORAL_TABLET | Freq: Two times a day (BID) | ORAL | 0 refills | Status: DC

## 2016-06-14 NOTE — Progress Notes (Signed)
Discharged to home with family office visits in place teaching done  

## 2016-06-14 NOTE — Progress Notes (Addendum)
  [facility] Progress Note  Subjective  - POD #1  No complaints.   Objective Vitals:   06/14/16 0450 06/14/16 0608  BP: (!) 187/91 (!) 157/70  Pulse: 96   Resp: 18   Temp: 98.4 F (36.9 C)     Intake/Output Summary (Last 24 hours) at 06/14/16 0727 Last data filed at 06/13/16 1700  Gross per 24 hour  Intake             1390 ml  Output              402 ml  Net              988 ml   Left temporal incision clean and intact.    Assessment/Planning: 61 y.o. female is s/p: left temporal artery biopsy 1 Day Post-Op   Surgical pathology pending.  Ok to resume xarelto Ok to d/c from vascular standpoint. Continue prednisone at d/c until path results back.   Lori Crane 06/14/2016 7:27 AM --  Laboratory CBC    Component Value Date/Time   WBC 8.8 06/14/2016 0320   HGB 15.2 (H) 06/14/2016 0320   HCT 44.7 06/14/2016 0320   PLT 302 06/14/2016 0320    BMET    Component Value Date/Time   NA 136 06/13/2016 0231   K 3.3 (L) 06/13/2016 0231   CL 103 06/13/2016 0231   CO2 26 06/13/2016 0231   GLUCOSE 98 06/13/2016 0231   BUN 13 06/13/2016 0231   CREATININE 0.69 06/13/2016 0231   CALCIUM 9.0 06/13/2016 0231   GFRNONAA >60 06/13/2016 0231   GFRAA >60 06/13/2016 0231    COAG Lab Results  Component Value Date   INR 1.0 07/13/2008   No results found for: PTT  Antibiotics Anti-infectives    None       Lori BergerKimberly Trinh, PA-C [facility] Office: [phone] Pager: [phone] 06/14/2016 7:27 AM   I have independently interviewed patient and agree with PA assessment and plan above. Ok for d/c when she is anticoagulated for dvt. Path pending.   Lori Ocain C. Randie Heinzain, MD [facility] [place] Office: [phone] Pager: [phone]     Path does not demonstrate temporal arteritis.   Lori Crane

## 2016-06-14 NOTE — Care Management Note (Signed)
Case Management Note Previous CM note initiated by Lawerance Sabalebbie Swist, RN 06/13/2016, 5:07 PM   Patient Details  Name: Lori Crane MRN: [id number] Date of Birth: 09/09/1955  Subjective/Objective:                 Patient from home alone, son supportive. [place] Xaralto pta for DVT, to have bx of temporal artery, c/o HA, uncontrolled HTN.  PCP LEAGUE-SOBON, JENNIFER     Action/Plan:  CM will continue to follow for DC planning.  Expected Discharge Date:  06/14/16               Expected Discharge Plan:  Home w Home Health Services  In-House Referral:     Discharge planning Services  CM Consult  Post Acute Care Choice:  NA Choice offered to:  NA  DME Arranged:    DME Agency:     HH Arranged:    HH Agency:     Status of Service:  Completed, signed off  If discussed at [employer] Length of Stay Meetings, dates discussed:    Discharge Disposition: home/self care   Additional Comments:  06/14/16- 1200- Donn PieriniKristi Ertha Nabor RN, CM- pt for d/c home today- no CM needs noted for discharge  Darrold SpanWebster, [name], RN 06/14/2016, 12:15 PM [phone]

## 2016-06-14 NOTE — Discharge Summary (Addendum)
Physician Discharge Summary  Lori NakayamaMadelyn D Crane ZOX:[id number] DOB: November 26, 1955 DOA: 06/10/2016  PCP: Eliott NineLeague-Sobon, Jennifer, MD  Admit date: 06/10/2016 Discharge date: 06/21/2016  Admitted From: Home Disposition: Home   Recommendations for Outpatient Follow-up:  1. Follow up with PCP in the next week.  2. Monitor BP: Had hypertensive urgency, discharged on increased metoprolol, new hydralazine as below in addition to other medications. 3. Please follow up on the results of temporal artery biopsy. Discharged on prednisone 60mg  daily pending results.   Home Health: None Equipment/Devices: None Discharge Condition: Stable CODE STATUS: Full Diet recommendation: Heart healthy  Brief/Interim Summary: Lori D McClintonis a 61 y.o.femalewith history of diabetes mellitus type 2, hypertension, hyperlipidemia and recent diagnosis of lower extremity DVT on Xarelto for the last month presented to the ER with persistent headache was also found to have hypertensive urgency. BP was gradually reduced and under severe range with increased metoprolol and added hydralazine.   Discharge Diagnoses:  Principal Problem:   Hypertensive urgency Active Problems:   Headache   Uncontrolled hypertension   Diabetes mellitus type 2, controlled (HCC)   History of DVT of lower extremity   Temporal arteritis (HCC)  Hypertensive urgency - continue patient's home dose of Micardis hydrochlorothiazide and increased metoprolol, added hydralazine.   Headache - sedimentation rate: 99.  -on-call neurologist Dr. Amada JupiterKirkpatrick: advised temporal artery biopsy and also to start prednisone until biopsy results are available. Prednisone 60 mg by mouth has been added. Vascular consulted and performed temporal artery biopsy, results pending at discharge.   Diarrhea  -resolved  History of DVT recently diagnosed on xarelto -bridged with heparin, restarted xarelto once biopsy done  T2DM: Continued home medications  w/SSI. - Monitor at follow up while on prednisone  Hyperlipidemia - Continue statin  Discharge Instructions Discharge Instructions    Diet - low sodium heart healthy    Complete by:  As directed    Discharge instructions    Complete by:  As directed    You were admitted for high blood pressure and a headache which may have been due to high blood pressure or a condition known as temporal arteritis (inflammation in the artery in the temple, which may cause blindness if not treated). The treatment for blood pressure has been changed as below and the treatment for arteritis is steroids. The biopsy has not resulted yet, so you will remain on steroids until you are contacted regarding these results.  - Continue a low salt, low carbohydrate diet - Increase metoprolol to 50mg  tablet TWICE DAILY (instead of daily) - Start taking hydralazine 50mg  three times daily - Continue taking other medications  - Continue taking prednisone 60mg  every morning. A 10-day course has been sent to your pharmacy and a longer course is printed and provided to you. If your results are positive, you will need to continue the steroids and may take this prescription to the pharmacy to continue while you await further instructions from your doctor.  - Call your PCP and follow up with her early next week. - If your headache returns, you notice changes in vision, or are unable to obtain or take any medications, seek medical attention right away.   Increase activity slowly    Complete by:  As directed      Allergies as of 06/14/2016      Reactions   Metformin And Related Other (See Comments)   Severe Yeast infection      Medication List    STOP taking these medications  cephALEXin 500 MG capsule Commonly known as:  KEFLEX     TAKE these medications   gabapentin 300 MG capsule Commonly known as:  NEURONTIN TAKE 1 CAPSULE BY MOUTH TID AS NEEDED FOR PAIN   hydrALAZINE 50 MG tablet Commonly known as:   APRESOLINE Take 1 tablet (50 mg total) by mouth every 8 (eight) hours.   HYDROcodone-acetaminophen 5-325 MG tablet Commonly known as:  NORCO Take 1 tablet by mouth every 6 (six) hours as needed for severe pain.   JARDIANCE 25 MG Tabs tablet Generic drug:  empagliflozin Take 25 mg by mouth daily.   metoprolol tartrate 50 MG tablet Commonly known as:  LOPRESSOR Take 1 tablet (50 mg total) by mouth 2 (two) times daily. What changed:  when to take this   ondansetron 4 MG tablet Commonly known as:  ZOFRAN Take 1 tablet (4 mg total) by mouth every 6 (six) hours.   oxyCODONE-acetaminophen 5-325 MG tablet Commonly known as:  PERCOCET/ROXICET Take 1-2 tablets by mouth every 6 (six) hours as needed for severe pain.   predniSONE 20 MG tablet Commonly known as:  DELTASONE Take 3 tablets (60 mg total) by mouth daily with breakfast.   rivaroxaban 20 MG Tabs tablet Commonly known as:  XARELTO Take 20 mg by mouth daily with supper.   simvastatin 20 MG tablet Commonly known as:  ZOCOR Take 20 mg by mouth every evening.   sitaGLIPtin-metformin 50-1000 MG tablet Commonly known as:  JANUMET Take 1 tablet by mouth 2 (two) times daily with a meal.   telmisartan-hydrochlorothiazide 80-25 MG tablet Commonly known as:  MICARDIS HCT Take 1 tablet by mouth daily.   TOUJEO SOLOSTAR 300 UNIT/ML Sopn Generic drug:  Insulin Glargine Inject 40 Units into the skin every morning.      Follow-up Information    Maeola Harman, MD.   Specialties:  Vascular Surgery, Cardiology Why:  As needed Contact information: [address] [place] [address] [phone]        Eliott Nine, MD. Schedule an appointment as soon as possible for a visit in 3 days.   Specialty:  Family Medicine Why:  PLEASE CALL TO ARRANGE Contact information: [address] [place] [address] [phone]          Allergies  Allergen Reactions  . Metformin And Related Other (See Comments)     Severe Yeast infection    Consultations:  Vascular surgery, Dr. Randie Heinz  Procedures/Studies: Ct Head Wo Contrast  Result Date: 06/10/2016 CLINICAL DATA:  Headache for 4 days. EXAM: CT HEAD WITHOUT CONTRAST TECHNIQUE: Contiguous axial images were obtained from the base of the skull through the vertex without intravenous contrast. COMPARISON:  None. FINDINGS: Brain: Minimal low-density in the periventricular right parietal lobe and scattered throughout the deep white matter is likely chronic small vessel ischemia. No evidence of hemorrhage, territorial infarct hydrocephalus, mass effect or midline shift. No subdural or extra-axial fluid collection. Vascular: Atherosclerosis of skullbase vasculature without hyperdense vessel or abnormal calcification. Skull: Normal. Negative for fracture or focal lesion. Sinuses/Orbits: Minimal debris in the right frontal sinus. Paranasal sinuses are otherwise well-aerated. Mastoid air cells are clear. Visualized orbits are unremarkable. Other: None. IMPRESSION: No acute intracranial abnormality. Mild chronic small vessel ischemia. Electronically Signed   By: Rubye Oaks M.D.   On: 06/10/2016 19:56   Left temporal artery biopsy 06/13/2016 by Dr. Randie Heinz  Subjective: Feels well.  Discharge Exam: Vitals:   06/14/16 0450 06/14/16 0608  BP: (!) 187/91 (!) 157/70  Pulse: 96   Resp: 18   Temp: 98.4 F (36.9 C)    General: Pt is alert, awake, not in acute distress Cardiovascular: RRR, S1/S2 +, no rubs, no gallops Respiratory: CTA bilaterally, no wheezing, no rhonchi Abdominal: Soft, NT, ND, bowel sounds + Extremities: No edema, no cyanosis Skin: Left temporal incision site c/d/I with glue, no erythema or discharge.  The results of significant diagnostics from this hospitalization (including imaging, microbiology, ancillary and laboratory) are listed below for reference.    Labs: BNP (last 3 results)  Recent Labs  05/04/16 2133  BNP 67.0   Basic  Metabolic Panel: No results for input(s): NA, K, CL, CO2, GLUCOSE, BUN, CREATININE, CALCIUM, MG, PHOS in the last 168 hours. Liver Function Tests: No results for input(s): AST, ALT, ALKPHOS, BILITOT, PROT, ALBUMIN in the last 168 hours. No results for input(s): LIPASE, AMYLASE in the last 168 hours. No results for input(s): AMMONIA in the last 168 hours. CBC: No results for input(s): WBC, NEUTROABS, HGB, HCT, MCV, PLT in the last 168 hours. Cardiac Enzymes: No results for input(s): CKTOTAL, CKMB, CKMBINDEX, TROPONINI in the last 168 hours. BNP: Invalid input(s): POCBNP CBG: No results for input(s): GLUCAP in the last 168 hours. D-Dimer No results for input(s): DDIMER in the last 72 hours. Hgb A1c No results for input(s): HGBA1C in the last 72 hours. Lipid Profile No results for input(s): CHOL, HDL, LDLCALC, TRIG, CHOLHDL, LDLDIRECT in the last 72 hours. Thyroid function studies No results for input(s): TSH, T4TOTAL, T3FREE, THYROIDAB in the last 72 hours.  Invalid input(s): FREET3 Anemia work up No results for input(s): VITAMINB12, FOLATE, FERRITIN, TIBC, IRON, RETICCTPCT in the last 72 hours. Urinalysis    Component Value Date/Time   COLORURINE YELLOW 05/04/2016 2239   APPEARANCEUR HAZY (A) 05/04/2016 2239   LABSPEC 1.016 05/04/2016 2239   PHURINE 6.0 05/04/2016 2239   GLUCOSEU NEGATIVE 05/04/2016 2239   HGBUR SMALL (A) 05/04/2016 2239   BILIRUBINUR NEGATIVE 05/04/2016 2239   KETONESUR NEGATIVE 05/04/2016 2239   PROTEINUR >=300 (A) 05/04/2016 2239   UROBILINOGEN 1.0 07/09/2013 1356   NITRITE NEGATIVE 05/04/2016 2239   LEUKOCYTESUR NEGATIVE 05/04/2016 2239    Microbiology Recent Results (from the past 240 hour(s))  Surgical PCR screen     Status: None   Collection Time: 06/13/16 11:37 AM  Result Value Ref Range Status   MRSA, PCR NEGATIVE NEGATIVE Final   Staphylococcus aureus NEGATIVE NEGATIVE Final    Comment:        The Xpert SA Assay (FDA approved for NASAL  specimens in patients over 70 years of age), is one component of a comprehensive surveillance program.  Test performance has been validated by [facility] for patients greater than or equal to 26 year old. It is not intended to diagnose infection nor to guide or monitor treatment.     Time coordinating discharge: Approximately 40 minutes  Hazeline Junker, MD  [facility] 06/21/2016, 5:35 PM Pager [phone]  If 7PM-7AM, please contact night-coverage [email or web address] Password [email or web address]

## 2016-06-14 NOTE — Discharge Instructions (Addendum)
DASH Eating Plan DASH stands for "Dietary Approaches to Stop Hypertension." The DASH eating plan is a healthy eating plan that has been shown to reduce high blood pressure (hypertension). It may also reduce your risk for type 2 diabetes, heart disease, and stroke. The DASH eating plan may also help with weight loss. What are tips for following this plan? General guidelines   Avoid eating more than 2,300 mg (milligrams) of salt (sodium) a day. If you have hypertension, you may need to reduce your sodium intake to 1,500 mg a day.  Limit alcohol intake to no more than 1 drink a day for nonpregnant women and 2 drinks a day for men. One drink equals 12 oz of beer, 5 oz of wine, or 1 oz of hard liquor.  Work with your health care provider to maintain a healthy body weight or to lose weight. Ask what an ideal weight is for you.  Get at least 30 minutes of exercise that causes your heart to beat faster (aerobic exercise) most days of the week. Activities may include walking, swimming, or biking.  Work with your health care provider or diet and nutrition specialist (dietitian) to adjust your eating plan to your individual calorie needs. Reading food labels   Check food labels for the amount of sodium per serving. Choose foods with less than 5 percent of the Daily Value of sodium. Generally, foods with less than 300 mg of sodium per serving fit into this eating plan.  To find whole grains, look for the word "whole" as the first word in the ingredient list. Shopping   Buy products labeled as "low-sodium" or "no salt added."  Buy fresh foods. Avoid canned foods and premade or frozen meals. Cooking   Avoid adding salt when cooking. Use salt-free seasonings or herbs instead of table salt or sea salt. Check with your health care provider or pharmacist before using salt substitutes.  Do not fry foods. Cook foods using healthy methods such as baking, boiling, grilling, and broiling instead.  Cook with  heart-healthy oils, such as olive, canola, soybean, or sunflower oil. Meal planning    Eat a balanced diet that includes:  5 or more servings of fruits and vegetables each day. At each meal, try to fill half of your plate with fruits and vegetables.  Up to 6-8 servings of whole grains each day.  Less than 6 oz of lean meat, poultry, or fish each day. A 3-oz serving of meat is about the same size as a deck of cards. One egg equals 1 oz.  2 servings of low-fat dairy each day.  A serving of nuts, seeds, or beans 5 times each week.  Heart-healthy fats. Healthy fats called Omega-3 fatty acids are found in foods such as flaxseeds and coldwater fish, like sardines, salmon, and mackerel.  Limit how much you eat of the following:  Canned or prepackaged foods.  Food that is high in trans fat, such as fried foods.  Food that is high in saturated fat, such as fatty meat.  Sweets, desserts, sugary drinks, and other foods with added sugar.  Full-fat dairy products.  Do not salt foods before eating.  Try to eat at least 2 vegetarian meals each week.  Eat more home-cooked food and less restaurant, buffet, and fast food.  When eating at a restaurant, ask that your food be prepared with less salt or no salt, if possible. What foods are recommended? The items listed may not be a complete list. Talk  with your dietitian about what dietary choices are best for you. Grains  Whole-grain or whole-wheat bread. Whole-grain or whole-wheat pasta. Brown rice. [place]ena Morrow. Bulgur. Whole-grain and low-sodium cereals. Pita bread. Low-fat, low-sodium crackers. Whole-wheat flour tortillas. Vegetables  Fresh or frozen vegetables (raw, steamed, roasted, or grilled). Low-sodium or reduced-sodium tomato and vegetable juice. Low-sodium or reduced-sodium tomato sauce and tomato paste. Low-sodium or reduced-sodium canned vegetables. Fruits  All fresh, dried, or frozen fruit. Canned fruit in natural juice  (without added sugar). Meat and other protein foods  Skinless chicken or [place]. Ground chicken or [place]. Pork with fat trimmed off. Fish and seafood. Egg whites. Dried beans, peas, or lentils. Unsalted nuts, nut butters, and seeds. Unsalted canned beans. Lean cuts of beef with fat trimmed off. Low-sodium, lean deli meat. Dairy  Low-fat (1%) or fat-free (skim) milk. Fat-free, low-fat, or reduced-fat cheeses. Nonfat, low-sodium ricotta or cottage cheese. Low-fat or nonfat yogurt. Low-fat, low-sodium cheese. Fats and oils  Soft margarine without trans fats. Vegetable oil. Low-fat, reduced-fat, or light mayonnaise and salad dressings (reduced-sodium). Canola, safflower, olive, soybean, and sunflower oils. Avocado. Seasoning and other foods  Herbs. Spices. Seasoning mixes without salt. Unsalted popcorn and pretzels. Fat-free sweets. What foods are not recommended? The items listed may not be a complete list. Talk with your dietitian about what dietary choices are best for you. Grains  Baked goods made with fat, such as croissants, muffins, or some breads. Dry pasta or rice meal packs. Vegetables  Creamed or fried vegetables. Vegetables in a cheese sauce. Regular canned vegetables (not low-sodium or reduced-sodium). Regular canned tomato sauce and paste (not low-sodium or reduced-sodium). Regular tomato and vegetable juice (not low-sodium or reduced-sodium). Angie Fava. Olives. Fruits  Canned fruit in a light or heavy syrup. Fried fruit. Fruit in cream or butter sauce. Meat and other protein foods  Fatty cuts of meat. Ribs. Fried meat. Berniece Salines. Sausage. Bologna and other processed lunch meats. Salami. Fatback. Hotdogs. Bratwurst. Salted nuts and seeds. Canned beans with added salt. Canned or smoked fish. Whole eggs or egg yolks. Chicken or [place] with skin. Dairy  Whole or 2% milk, cream, and half-and-half. Whole or full-fat cream cheese. Whole-fat or sweetened yogurt. Full-fat cheese. Nondairy creamers.  Whipped toppings. Processed cheese and cheese spreads. Fats and oils  Butter. Stick margarine. Lard. Shortening. Ghee. Bacon fat. Tropical oils, such as coconut, palm kernel, or palm oil. Seasoning and other foods  Salted popcorn and pretzels. Onion salt, garlic salt, seasoned salt, table salt, and sea salt. Worcestershire sauce. Tartar sauce. Barbecue sauce. Teriyaki sauce. Soy sauce, including reduced-sodium. Steak sauce. Canned and packaged gravies. Fish sauce. Oyster sauce. Cocktail sauce. Horseradish that you find on the shelf. Ketchup. Mustard. Meat flavorings and tenderizers. Bouillon cubes. Hot sauce and Tabasco sauce. Premade or packaged marinades. Premade or packaged taco seasonings. Relishes. Regular salad dressings. Where to find more information:  National Heart, Lung, and [place]: https://wilson-eaton.com/  American Heart Association: www.heart.org Summary  The DASH eating plan is a healthy eating plan that has been shown to reduce high blood pressure (hypertension). It may also reduce your risk for type 2 diabetes, heart disease, and stroke.  With the DASH eating plan, you should limit salt (sodium) intake to 2,300 mg a day. If you have hypertension, you may need to reduce your sodium intake to 1,500 mg a day.  When on the DASH eating plan, aim to eat more fresh fruits and vegetables, whole grains, lean proteins, low-fat dairy, and heart-healthy fats.  Work  with your health care provider or diet and nutrition specialist (dietitian) to adjust your eating plan to your individual calorie needs. This information is not intended to replace advice given to you by your health care provider. Make sure you discuss any questions you have with your health care provider. Document Released: 01/10/2011 Document Revised: 01/15/2016 Document Reviewed: 01/15/2016 Elsevier Interactive Patient Education  2017 Elsevier Inc. Temporal Arteritis Temporal arteritis, also called giant cell arteritis,  is a condition that causes arteries to become swollen (inflamed). It usually affects arteries in your head and face, but arteries in any part of the body can become inflamed. Temporal arteritis can cause serious problems, such as bone loss, diabetes, and blindness. What are the causes? The cause is unknown. What increases the risk?  Being older than 50.  Being a woman.  Being Caucasian.  Being of [place], [place], [place], [place], or [place] ancestry.  Having polymyalgia rheumatica (PMR). What are the signs or symptoms? Some people with temporal arteritis have just one symptom, while other have several symptoms. Most signs and symptoms are related to the head and face. Signs and symptoms may include:  Hard or swollen temples (common). Your temples are the flattened area on either side of your forehead. If your temples are swollen, it may hurt to touch them.  Pain when combing your hair or when laying your head down.  Pain in the jaw when chewing.  Pain in the throat or tongue.  Problems with your vision, such as sudden loss of vision in one eye, or seeing double.  Fever.  Fatigue.  A dry cough.  Pain in the hips and shoulders.  Pain in the arms during exercise.  Depression.  Weight loss. How is this diagnosed? Your health care provider will ask about your symptoms and do a physical exam. He or she may also perform an eye exam and tests, such as:  A complete blood count.  An erythrocyte sedimentation rate test, also called the sed rate test.  A C-reactive protein (CRP) test.  A tissue sample (biopsy) test. How is this treated? Temporal arteritis is treated with a type of medicine called a corticosteroid. Vision problems may be treated with additional medicines. You will need to see your health care provider while you are being treated. During follow-up visits, your health care provider will check for problems by:  Performing blood tests and bone density  tests.  Checking your blood pressure and blood sugar. Follow these instructions at home:  Take medicines only as directed by your health care provider.  Take any vitamins or supplements that your health care provider suggests. These may include vitamin D and calcium, which help keep your bones from becoming weak.  Exercise. Talk with your health care provider about what exercises are okay for you to do. Usually exercises that increase your heart rate (aerobic exercise), such as walking, are recommended. Aerobic exercise helps control your blood pressure and prevent bone loss.  Follow a healthy diet. Include healthy sources of protein, fruits, vegetables, and whole grains in your diet. Following a healthy diet helps prevent bone damage and diabetes. Contact a health care provider if:  Your symptoms get worse.  Your fever, fatigue, headache, weight loss, or pain in your jaw gets worse.  You develop signs of infection, such as fever, swelling, redness, warmth, and tenderness. Get help right away if:  Your vision gets worse.  Your pain does not go away, even after you take pain medicine.  You have chest  pain.  You have trouble breathing.  One side of your face or body suddenly becomes weak or numb. This information is not intended to replace advice given to you by your health care provider. Make sure you discuss any questions you have with your health care provider. Document Released: 11/18/2008 Document Revised: 09/21/2015 Document Reviewed: 03/17/2013 Elsevier Interactive Patient Education  2017 [employer]    Information on my medicine - XARELTO (rivaroxaban)  This medication education was reviewed with me or my healthcare representative as part of my discharge preparation.  The pharmacist that spoke with me during my hospital stay was:  Pasty Spillersobertson, Keithan Dileonardo Stillinger, [facility] CenterRPH  WHY WAS Carlena HurlXARELTO PRESCRIBED FOR YOU? Xarelto was prescribed to treat blood clots that may have been  found in the veins of your legs (deep vein thrombosis) or in your lungs (pulmonary embolism) and to reduce the risk of them occurring again.  What do you need to know about Xarelto? The dose is one 20 mg tablet taken ONCE A DAY with your evening meal.  DO NOT stop taking Xarelto without talking to the health care provider who prescribed the medication.  Refill your prescription for 20 mg tablets before you run out.  After discharge, you should have regular check-up appointments with your healthcare provider that is prescribing your Xarelto.  In the future your dose may need to be changed if your kidney function changes by a significant amount.  What do you do if you miss a dose? If you are taking Xarelto TWICE DAILY and you miss a dose, take it as soon as you remember. You may take two 15 mg tablets (total 30 mg) at the same time then resume your regularly scheduled 15 mg twice daily the next day.  If you are taking Xarelto ONCE DAILY and you miss a dose, take it as soon as you remember on the same day then continue your regularly scheduled once daily regimen the next day. Do not take two doses of Xarelto at the same time.   Important Safety Information Xarelto is a blood thinner medicine that can cause bleeding. You should call your healthcare provider right away if you experience any of the following: ? Bleeding from an injury or your nose that does not stop. ? Unusual colored urine (red or dark brown) or unusual colored stools (red or black). ? Unusual bruising for unknown reasons. ? A serious fall or if you hit your head (even if there is no bleeding).  Some medicines may interact with Xarelto and might increase your risk of bleeding while on Xarelto. To help avoid this, consult your healthcare provider or pharmacist prior to using any new prescription or non-prescription medications, including herbals, vitamins, non-steroidal anti-inflammatory drugs (NSAIDs) and  supplements.  This website has more information on Xarelto: VisitDestination.com.[email or web address].xarelto.[email or web address].

## 2016-06-14 NOTE — Progress Notes (Signed)
Hypoglycemic Event  CBG: 60  Treatment: 15 GM carbohydrate snack  Symptoms: None  Follow-up CBG: Time:630am          CBG Result: 92  Possible Reasons for Event: Inadequate meal intake    Lori Crane M

## 2017-02-26 ENCOUNTER — Other Ambulatory Visit: Payer: Self-pay

## 2017-02-26 ENCOUNTER — Encounter (HOSPITAL_COMMUNITY): Payer: Self-pay

## 2017-02-26 DIAGNOSIS — E119 Type 2 diabetes mellitus without complications: Secondary | ICD-10-CM | POA: Insufficient documentation

## 2017-02-26 DIAGNOSIS — M25512 Pain in left shoulder: Secondary | ICD-10-CM | POA: Diagnosis not present

## 2017-02-26 DIAGNOSIS — I10 Essential (primary) hypertension: Secondary | ICD-10-CM | POA: Insufficient documentation

## 2017-02-26 DIAGNOSIS — Z79899 Other long term (current) drug therapy: Secondary | ICD-10-CM | POA: Insufficient documentation

## 2017-02-26 DIAGNOSIS — Z86718 Personal history of other venous thrombosis and embolism: Secondary | ICD-10-CM | POA: Insufficient documentation

## 2017-02-26 DIAGNOSIS — Z041 Encounter for examination and observation following transport accident: Secondary | ICD-10-CM | POA: Diagnosis present

## 2017-02-26 DIAGNOSIS — Z87891 Personal history of nicotine dependence: Secondary | ICD-10-CM | POA: Diagnosis not present

## 2017-02-26 DIAGNOSIS — Z7901 Long term (current) use of anticoagulants: Secondary | ICD-10-CM | POA: Insufficient documentation
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
## 2017-02-27 ENCOUNTER — Emergency Department (HOSPITAL_COMMUNITY)
Admission: EM | Admit: 2017-02-27 | Discharge: 2017-02-27 | Disposition: A | Discharge status: Home / Self Care | Payer: BC Managed Care – PPO | Attending: Emergency Medicine | Admitting: Emergency Medicine

## 2017-02-27 DIAGNOSIS — M25512 Pain in left shoulder: Secondary | ICD-10-CM | POA: Diagnosis not present

## 2017-02-27 DIAGNOSIS — M7918 Myalgia, other site: Secondary | ICD-10-CM

## 2017-02-27 MED ORDER — METHOCARBAMOL 500 MG PO TABS: 500.0000 mg | ORAL_TABLET | Freq: Two times a day (BID) | ORAL | 0 refills | Status: DC

## 2017-02-27 MED ORDER — HYDROCODONE-ACETAMINOPHEN 5-325 MG PO TABS
1.0000 | ORAL_TABLET | ORAL | 0 refills | Status: AC | PRN
Start: 2017-02-27 — End: ?

## 2017-02-27 MED ORDER — METHOCARBAMOL 500 MG PO TABS
500.0000 mg | ORAL_TABLET | Freq: Once | ORAL | Status: AC
  Administered 2017-02-27: 500 mg via ORAL
  Filled 2017-02-27: qty 1

## 2017-02-27 MED ORDER — HYDROCODONE-ACETAMINOPHEN 5-325 MG PO TABS
1.0000 | ORAL_TABLET | Freq: Once | ORAL | Status: AC
  Administered 2017-02-27: 1 via ORAL
  Filled 2017-02-27: qty 1
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
## 2017-02-27 NOTE — Discharge Instructions (Signed)
Cool compresses in the 1st 48 hours then you can alternate heat with ice. Take medications as prescribed and follow up with your doctor for recheck in 3-4 days. If there is any shortness of breath, chest pain, extensive bruising, return to the emergency department for re-evaluation.

## 2018-06-10 IMAGING — US US EXTREM LOW VENOUS BILAT
1 series · 13 of 24 positions shown · non-contrast
Comparison: None.

CLINICAL DATA: Bilateral lower extremity edema for 2 days.



[Series 1: us extrem low venous bilat · 0.08mm/px · 67 acquisitions, 13 frames shown]
[im 1/67]
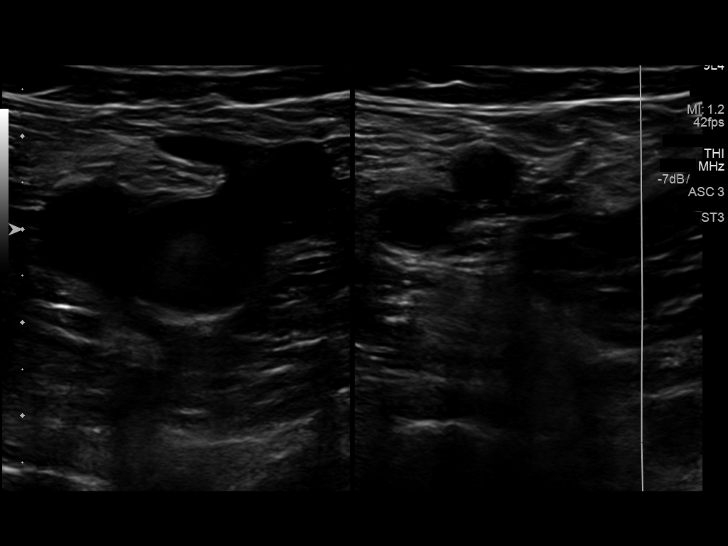
[im 6/67]
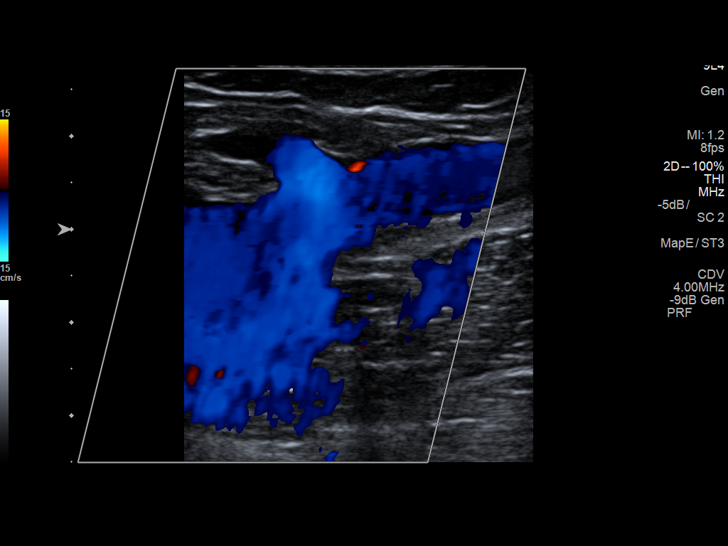
[im 12/67]
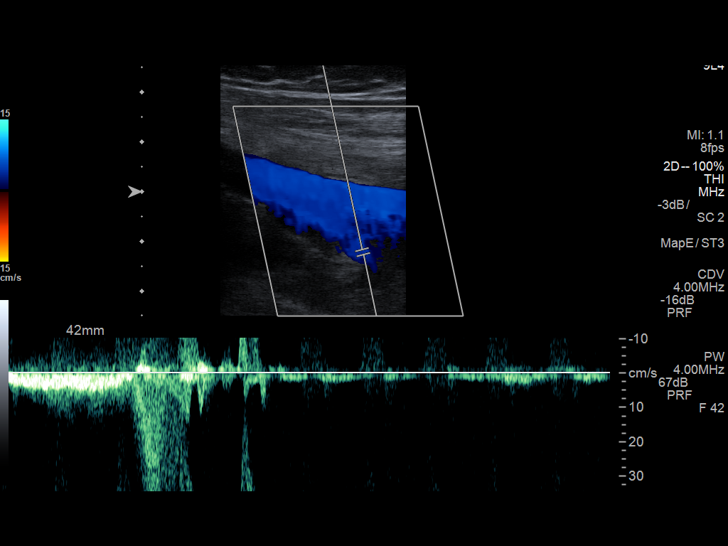
[im 18/67]
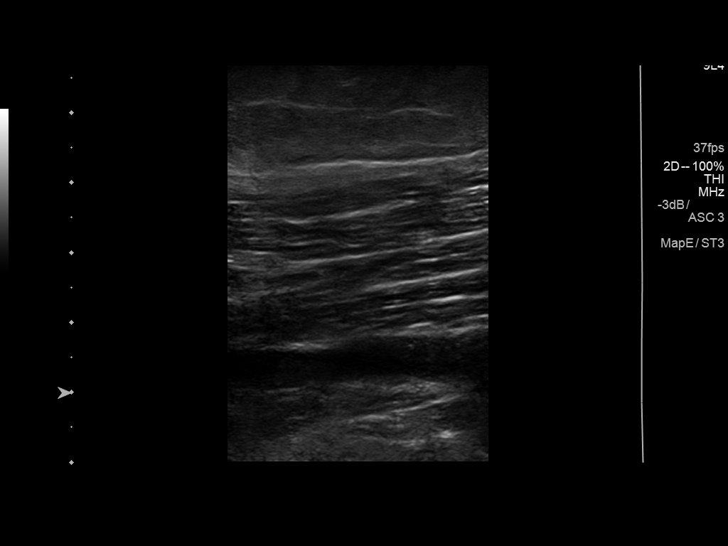
[im 23/67]
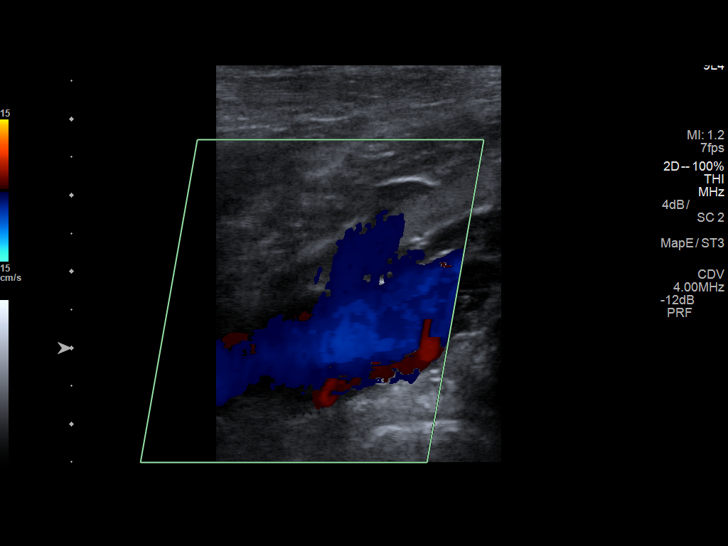
[im 29/67]
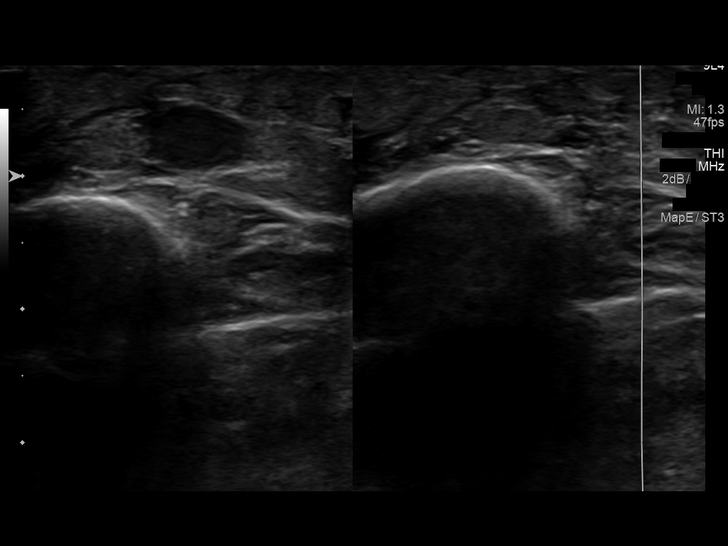
[im 38/67]
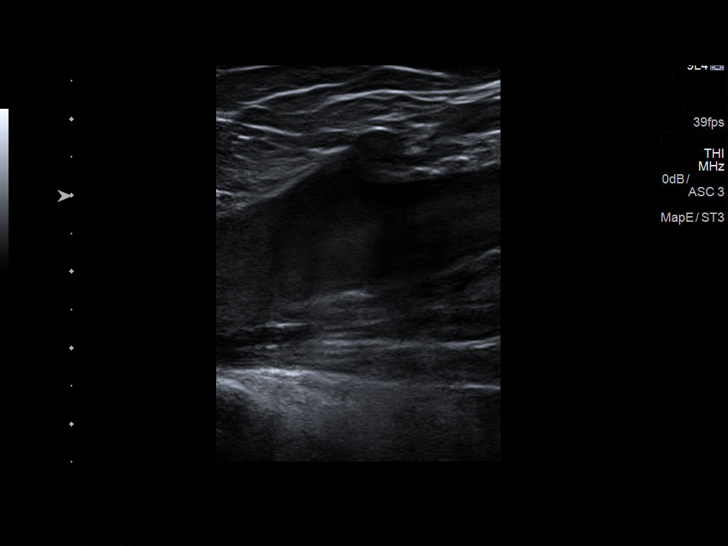
[im 38/67]
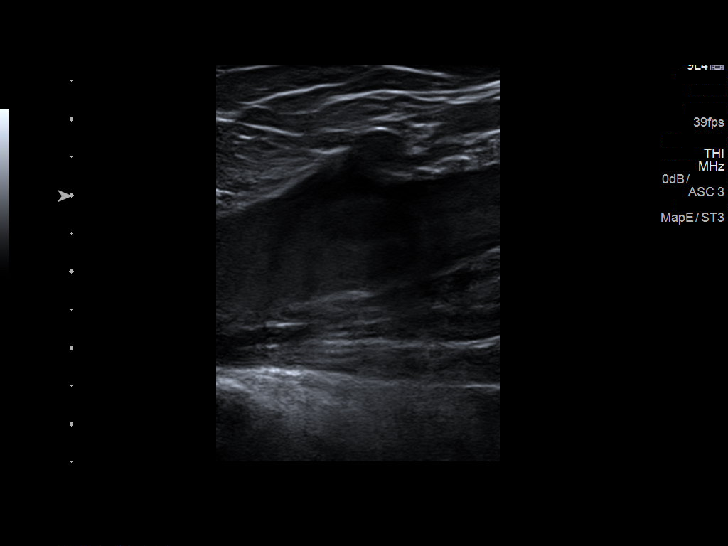
[im 44/67]
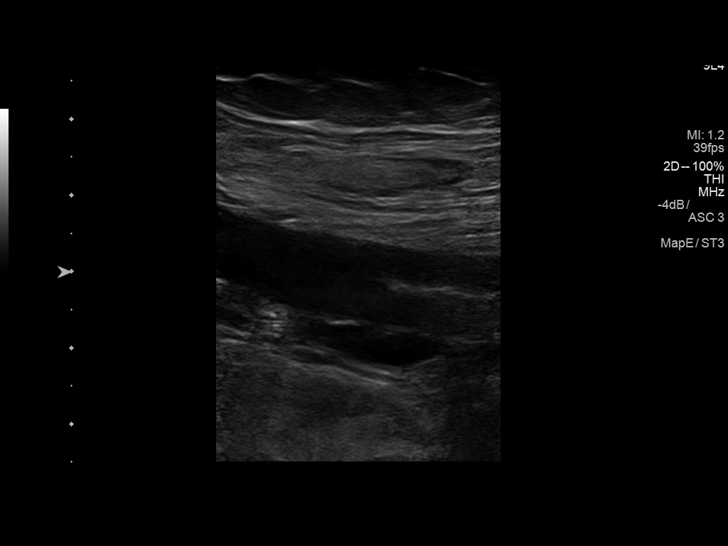
[im 49/67]
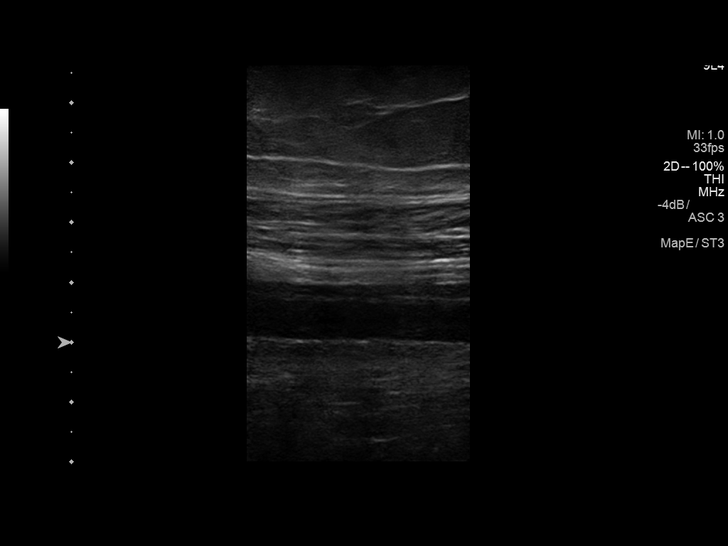
[im 55/67]
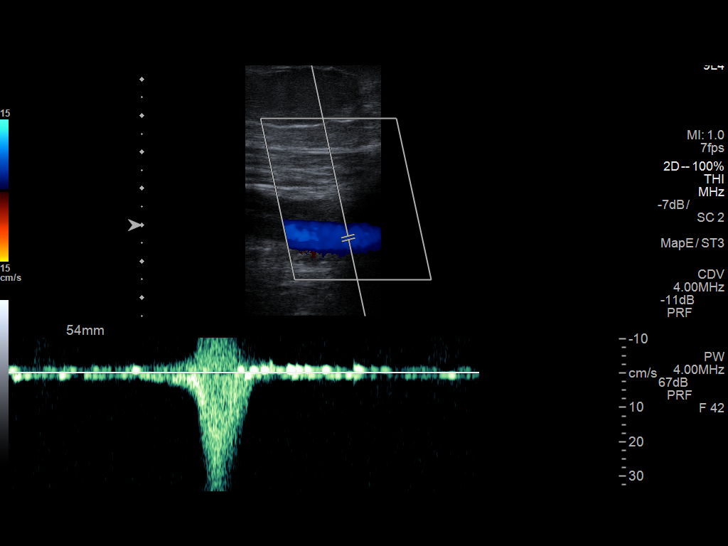
[im 61/67]
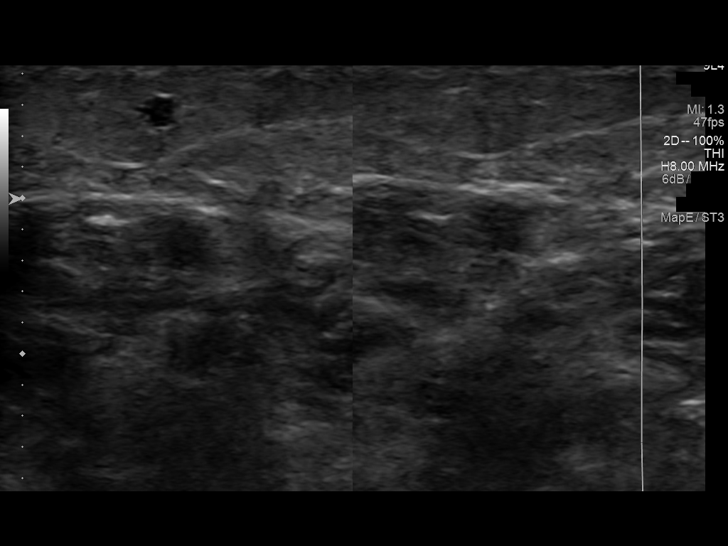
[im 67/67]
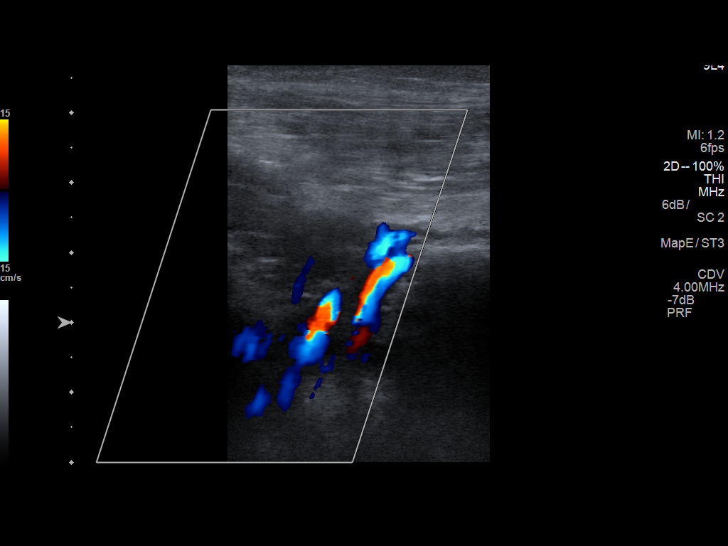

[13 of 24 positions shown; findings below may reference images not displayed]

FINDINGS: RIGHT LOWER EXTREMITY

Common Femoral Vein: No evidence of thrombus. Normal
compressibility, respiratory phasicity and response to augmentation.

Saphenofemoral Junction: No evidence of thrombus. Normal
compressibility and flow on color Doppler imaging.

Profunda Femoral Vein: No evidence of thrombus. Normal
compressibility and flow on color Doppler imaging.

Femoral Vein: No evidence of thrombus. Normal compressibility,
respiratory phasicity and response to augmentation.

Popliteal Vein: There is thrombosis within the popliteal vein with
lack of compressibility.

Calf Veins: No evidence of thrombus. Normal compressibility and flow
on color Doppler imaging.

Superficial Great Saphenous Vein: No evidence of thrombus. Normal
compressibility and flow on color Doppler imaging.

Venous Reflux:  None.

Other Findings:  None.

LEFT LOWER EXTREMITY

Common Femoral Vein: No evidence of thrombus. Normal
compressibility, respiratory phasicity and response to augmentation.

Saphenofemoral Junction: Thrombosis is seen at this athero femoral
junction extending into the greater saphenous vein proximally.

Profunda Femoral Vein: No evidence of thrombus. Normal
compressibility and flow on color Doppler imaging.

Femoral Vein: No evidence of thrombus. Normal compressibility,
respiratory phasicity and response to augmentation.

Popliteal Vein: No evidence of thrombus. Normal compressibility,
respiratory phasicity and response to augmentation.

Calf Veins: No evidence of thrombus. Normal compressibility and flow
on color Doppler imaging.

Superficial Great Saphenous Vein: No evidence of thrombus. Normal
compressibility and flow on color Doppler imaging.

Venous Reflux:  None.

Other Findings:  None.
IMPRESSION: 1. Deep venous thrombosis noted in the right popliteal vein and at
the saphenous femoral junction on the left.

## 2018-08-03 ENCOUNTER — Emergency Department (HOSPITAL_BASED_OUTPATIENT_CLINIC_OR_DEPARTMENT_OTHER)
Admission: EM | Admit: 2018-08-03 | Discharge: 2018-08-03 | Disposition: A | Discharge status: Home / Self Care | Payer: BC Managed Care – PPO | Attending: Emergency Medicine | Admitting: Emergency Medicine

## 2018-08-03 ENCOUNTER — Encounter (HOSPITAL_BASED_OUTPATIENT_CLINIC_OR_DEPARTMENT_OTHER): Payer: Self-pay | Admitting: *Deleted

## 2018-08-03 ENCOUNTER — Emergency Department (HOSPITAL_BASED_OUTPATIENT_CLINIC_OR_DEPARTMENT_OTHER): Payer: BC Managed Care – PPO

## 2018-08-03 ENCOUNTER — Other Ambulatory Visit: Payer: Self-pay

## 2018-08-03 DIAGNOSIS — R509 Fever, unspecified: Secondary | ICD-10-CM

## 2018-08-03 DIAGNOSIS — Z87891 Personal history of nicotine dependence: Secondary | ICD-10-CM | POA: Insufficient documentation

## 2018-08-03 DIAGNOSIS — I10 Essential (primary) hypertension: Secondary | ICD-10-CM | POA: Insufficient documentation

## 2018-08-03 DIAGNOSIS — Z79899 Other long term (current) drug therapy: Secondary | ICD-10-CM | POA: Diagnosis not present

## 2018-08-03 DIAGNOSIS — E119 Type 2 diabetes mellitus without complications: Secondary | ICD-10-CM | POA: Insufficient documentation

## 2018-08-03 DIAGNOSIS — E860 Dehydration: Secondary | ICD-10-CM | POA: Insufficient documentation

## 2018-08-03 DIAGNOSIS — N3000 Acute cystitis without hematuria: Secondary | ICD-10-CM

## 2018-08-03 DIAGNOSIS — Z20828 Contact with and (suspected) exposure to other viral communicable diseases: Secondary | ICD-10-CM | POA: Insufficient documentation

## 2018-08-03 DIAGNOSIS — R4182 Altered mental status, unspecified: Secondary | ICD-10-CM | POA: Diagnosis present

## 2018-08-03 DIAGNOSIS — R4020 Unspecified coma: Secondary | ICD-10-CM

## 2018-08-03 LAB — CBC WITH DIFFERENTIAL/PLATELET
Abs Immature Granulocytes: 0.01 10*3/uL (ref 0.00–0.07)
Basophils Absolute: 0 10*3/uL (ref 0.0–0.1)
Basophils Relative: 0 %
Eosinophils Absolute: 0 10*3/uL (ref 0.0–0.5)
Eosinophils Relative: 0 %
HCT: 42.3 % (ref 36.0–46.0)
Hemoglobin: 13.7 g/dL (ref 12.0–15.0)
Immature Granulocytes: 0 %
Lymphocytes Relative: 32 %
Lymphs Abs: 1.4 10*3/uL (ref 0.7–4.0)
MCH: 26.6 pg (ref 26.0–34.0)
MCHC: 32.4 g/dL (ref 30.0–36.0)
MCV: 82 fL (ref 80.0–100.0)
Monocytes Absolute: 0.3 10*3/uL (ref 0.1–1.0)
Monocytes Relative: 6 %
Neutro Abs: 2.7 10*3/uL (ref 1.7–7.7)
Neutrophils Relative %: 62 %
Platelets: 131 10*3/uL — ABNORMAL LOW (ref 150–400)
RBC: 5.16 MIL/uL — ABNORMAL HIGH (ref 3.87–5.11)
RDW: 12.8 % (ref 11.5–15.5)
WBC: 4.4 10*3/uL (ref 4.0–10.5)
nRBC: 0 % (ref 0.0–0.2)

## 2018-08-03 LAB — URINALYSIS, ROUTINE W REFLEX MICROSCOPIC
Bilirubin Urine: NEGATIVE
Glucose, UA: >=500 mg/dL — AB
Ketones, ur: NEGATIVE mg/dL
Leukocytes,Ua: NEGATIVE
Nitrite: NEGATIVE
Protein, ur: >300 mg/dL — AB
Specific Gravity, Urine: 1.025 (ref 1.005–1.030)
pH: 6 (ref 5.0–8.0)

## 2018-08-03 LAB — COMPREHENSIVE METABOLIC PANEL
ALT: 28 U/L (ref 0–44)
AST: 51 U/L — ABNORMAL HIGH (ref 15–41)
Albumin: 3.5 g/dL (ref 3.5–5.0)
Alkaline Phosphatase: 55 U/L (ref 38–126)
Anion gap: 11 (ref 5–15)
BUN: 23 mg/dL (ref 8–23)
CO2: 27 mmol/L (ref 22–32)
Calcium: 8.9 mg/dL (ref 8.9–10.3)
Chloride: 94 mmol/L — ABNORMAL LOW (ref 98–111)
Creatinine, Ser: 1.08 mg/dL — ABNORMAL HIGH (ref 0.44–1.00)
GFR calc Af Amer: >60 mL/min (ref >60)
GFR calc non Af Amer: 55 mL/min — ABNORMAL LOW (ref >60)
Glucose, Bld: 141 mg/dL — ABNORMAL HIGH (ref 70–99)
Potassium: 3.2 mmol/L — ABNORMAL LOW (ref 3.5–5.1)
Sodium: 132 mmol/L — ABNORMAL LOW (ref 135–145)
Total Bilirubin: 0.5 mg/dL (ref 0.3–1.2)
Total Protein: 7.8 g/dL (ref 6.5–8.1)

## 2018-08-03 LAB — URINALYSIS, MICROSCOPIC (REFLEX): WBC, UA: >50 WBC/hpf (ref 0–5)

## 2018-08-03 LAB — LACTIC ACID, PLASMA: Lactic Acid, Venous: 1.5 mmol/L (ref 0.5–1.9)

## 2018-08-03 LAB — SARS CORONAVIRUS 2 AG (30 MIN TAT): SARS Coronavirus 2 Ag: NEGATIVE

## 2018-08-03 MED ORDER — CEPHALEXIN 500 MG PO CAPS: 500.0000 mg | ORAL_CAPSULE | Freq: Four times a day (QID) | ORAL | 0 refills | Status: DC

## 2018-08-03 MED ORDER — CEPHALEXIN 250 MG PO CAPS
500.0000 mg | ORAL_CAPSULE | Freq: Once | ORAL | Status: AC
  Administered 2018-08-03: 500 mg via ORAL
  Filled 2018-08-03: qty 2

## 2018-08-03 MED ORDER — ACETAMINOPHEN 325 MG PO TABS
650.0000 mg | ORAL_TABLET | Freq: Once | ORAL | Status: AC
  Administered 2018-08-03: 650 mg via ORAL
  Filled 2018-08-03: qty 2

## 2018-08-03 MED ORDER — LACTATED RINGERS IV BOLUS
1000.0000 mL | Freq: Once | INTRAVENOUS | Status: AC
  Administered 2018-08-03: 1000 mL via INTRAVENOUS

## 2018-08-03 MED ORDER — SODIUM CHLORIDE 0.9% FLUSH
3.0000 mL | Freq: Once | INTRAVENOUS | Status: AC
  Administered 2018-08-03: 3 mL via INTRAVENOUS
  Filled 2018-08-03: qty 3

## 2018-08-03 NOTE — ED Notes (Signed)
Patient aware of need for urine. 

## 2018-08-03 NOTE — Discharge Instructions (Signed)
Make sure you are drinking plenty of fluids and taking the antibiotic as prescribed.  Make sure you are getting plenty of rest.  Your urine was cultured today so if it does not look like the antibiotics will work someone will call you and change them in about 2 days.

## 2018-08-03 NOTE — ED Provider Notes (Signed)
[facility] EMERGENCY DEPARTMENT Provider Note   CSN: [id number] Arrival date & time: 08/03/18  1901     History   Chief Complaint Chief Complaint  Patient presents with  . Altered Mental Status    HPI Lori Crane is a 63 y.o. female.     Patient is a 63 year old female with a history of hypertension, diabetes, prior DVT but is no longer on Xarelto who is presenting today with 2 days of general malaise, mild confusion, feeling short of breath with walking and just not herself.  Family member who is with the patient states she is always very sharp and on top of everything and she was getting mixed up with some of her medications she was just very listless and anytime she got up and walk she look like she was struggling to breathe.  She is also had decreased appetite but no weight changes.  She denies any chest pain, coughing, abdominal pain, nausea or vomiting.  She denies urinary symptoms.  No unilateral weakness or numbness.  Vision is unchanged.  Patient was found to be febrile here and when asked when the fever started she dates she did not know she was running a fever.  She has been out and about in the community but has not had any known sick contacts.  The history is provided by the patient and a relative.  Altered Mental Status Presenting symptoms: confusion and lethargy   Severity:  Moderate Most recent episode:  2 days ago Episode history:  Continuous Timing:  Constant Progression:  Waxing and waning Chronicity:  New Context: not alcohol use, not dementia, not drug use, taking medications as prescribed, not nursing home resident, not recent change in medication and not recent illness   Associated symptoms: difficulty breathing, fever and weakness   Associated symptoms: no abdominal pain, no nausea, no slurred speech and no vomiting     Past Medical History:  Diagnosis Date  . Diabetes mellitus without complication (HCC)   . Hypercholesterolemia   .  Hypertension     Patient Active Problem List   Diagnosis Date Noted  . Temporal arteritis (HCC)   . Headache 06/11/2016  . Uncontrolled hypertension 06/11/2016  . Diabetes mellitus type 2, controlled (HCC) 06/11/2016  . History of DVT of lower extremity 06/11/2016  . Hypertensive urgency     Past Surgical History:  Procedure Laterality Date  . ARTERY BIOPSY Left 06/13/2016   Procedure: LEFT TEMPORAL ARTERY BIOPSY;  Surgeon: Maeola Harmanain, Brandon Christopher, MD;  [facility];  Service: Vascular;  Laterality: Left;     OB History   No obstetric history on file.      Home Medications    Prior to Admission medications   Medication Sig Start Date End Date Taking? Authorizing Provider  empagliflozin (JARDIANCE) 25 MG TABS tablet Take 25 mg by mouth daily.  04/30/16  Yes [provider]  gabapentin (NEURONTIN) 300 MG capsule TAKE 1 CAPSULE BY MOUTH TID AS NEEDED FOR PAIN 04/08/16  Yes [provider]  hydrALAZINE (APRESOLINE) 50 MG tablet Take 1 tablet (50 mg total) by mouth every 8 (eight) hours. 06/14/16  Yes Tyrone NineGrunz, Ryan B, MD  metoprolol (LOPRESSOR) 50 MG tablet Take 1 tablet (50 mg total) by mouth 2 (two) times daily. 06/14/16  Yes Tyrone NineGrunz, Ryan B, MD  simvastatin (ZOCOR) 20 MG tablet Take 20 mg by mouth every evening.    Yes [provider]  HYDROcodone-acetaminophen (NORCO/VICODIN) 5-325 MG tablet Take 1 tablet by  mouth every 4 (four) hours as needed. 02/27/17   Elpidio AnisUpstill, Shari, PA-C  methocarbamol (ROBAXIN) 500 MG tablet Take 1 tablet (500 mg total) by mouth 2 (two) times daily. 02/27/17   Elpidio AnisUpstill, Shari, PA-C  ondansetron (ZOFRAN) 4 MG tablet Take 1 tablet (4 mg total) by mouth every 6 (six) hours. Patient not taking: Reported on 06/07/2016 07/09/13   Phillis HaggisMabe, Martha L, MD  oxyCODONE-acetaminophen (PERCOCET/ROXICET) 5-325 MG per tablet Take 1-2 tablets by mouth every 6 (six) hours as needed for severe pain. Patient not taking: Reported on 06/07/2016 07/09/13   Phillis HaggisMabe, Martha  L, MD  predniSONE (DELTASONE) 20 MG tablet Take 3 tablets (60 mg total) by mouth daily with breakfast. 06/15/16   Tyrone NineGrunz, Ryan B, MD  rivaroxaban (XARELTO) 20 MG TABS tablet Take 20 mg by mouth daily with supper.  05/23/16 05/23/17  [provider]  sitaGLIPtin-metformin (JANUMET) 50-1000 MG tablet Take 1 tablet by mouth 2 (two) times daily with a meal. 06/07/16   Rolland PorterJames, Mark, MD  telmisartan-hydrochlorothiazide (MICARDIS HCT) 80-25 MG per tablet Take 1 tablet by mouth daily.     [provider]  TOUJEO SOLOSTAR 300 UNIT/ML SOPN Inject 40 Units into the skin every morning.  01/23/15   [provider]    Family History Family History  Problem Relation Age of Onset  . Hypertension Other     Social History Social History   Tobacco Use  . Smoking status: Former Games developermoker  . Smokeless tobacco: Former Engineer, waterUser  Substance Use Topics  . Alcohol use: Not Currently    Comment: socially   . Drug use: No     Allergies   Metformin and related   Review of Systems Review of Systems  Constitutional: Positive for fever.  Gastrointestinal: Negative for abdominal pain, nausea and vomiting.  Neurological: Positive for weakness.  Psychiatric/Behavioral: Positive for confusion.  All other systems reviewed and are negative.    Physical Exam Updated Vital Signs BP 104/60   Pulse 98   Temp (!) 103 F (39.4 C) (Oral)   Resp (!) 22   Ht 5' 6.5" (1.689 m)   Wt 81.6 kg   SpO2 93%   BMI 28.62 kg/m   Physical Exam Vitals signs and nursing note reviewed.  Constitutional:      General: She is not in acute distress.    Appearance: She is well-developed and normal weight.  HENT:     Head: Normocephalic and atraumatic.     Right Ear: Tympanic membrane normal.     Left Ear: Tympanic membrane normal.  Eyes:     Pupils: Pupils are equal, round, and reactive to light.  Neck:     Musculoskeletal: Normal range of motion and neck supple. No muscular tenderness.  Cardiovascular:      Rate and Rhythm: Normal rate and regular rhythm.     Heart sounds: Normal heart sounds. No murmur. No friction rub.  Pulmonary:     Effort: Pulmonary effort is normal.     Breath sounds: Normal breath sounds. No wheezing or rales.  Abdominal:     General: Bowel sounds are normal. There is no distension.     Palpations: Abdomen is soft.     Tenderness: There is no abdominal tenderness. There is no guarding or rebound.  Musculoskeletal: Normal range of motion.        General: No tenderness.     Right lower leg: No edema.     Left lower leg: No edema.  Comments: No edema  Skin:    General: Skin is warm and dry.     Findings: No rash.  Neurological:     General: No focal deficit present.     Mental Status: She is alert.     Cranial Nerves: No cranial nerve deficit.     Sensory: No sensory deficit.     Motor: No weakness.     Coordination: Coordination normal.     Comments: Occasionally will have to stop and redirect herself but can answer questions appropriately.  Psychiatric:        Behavior: Behavior normal.     Comments: Calm and cooperative      ED Treatments / Results  Labs (all labs ordered are listed, but only abnormal results are displayed) Labs Reviewed  COMPREHENSIVE METABOLIC PANEL - Abnormal; Notable for the following components:      Result Value   Sodium 132 (*)    Potassium 3.2 (*)    Chloride 94 (*)    Glucose, Bld 141 (*)    Creatinine, Ser 1.08 (*)    AST 51 (*)    GFR calc non Af Amer 55 (*)    All other components within normal limits  CBC WITH DIFFERENTIAL/PLATELET - Abnormal; Notable for the following components:   RBC 5.16 (*)    Platelets 131 (*)    All other components within normal limits  URINALYSIS, ROUTINE W REFLEX MICROSCOPIC - Abnormal; Notable for the following components:   APPearance CLOUDY (*)    Glucose, UA >=500 (*)    Hgb urine dipstick MODERATE (*)    Protein, ur >300 (*)    All other components within normal limits   URINALYSIS, MICROSCOPIC (REFLEX) - Abnormal; Notable for the following components:   Bacteria, UA MANY (*)    All other components within normal limits  SARS CORONAVIRUS 2 (HOSP ORDER, PERFORMED IN [place] LAB VIA ABBOTT ID)  URINE CULTURE  LACTIC ACID, PLASMA    EKG None  Radiology Ct Head Wo Contrast  Result Date: 08/03/2018 CLINICAL DATA:  Confusion, shortness of breath, fatigue EXAM: CT HEAD WITHOUT CONTRAST TECHNIQUE: Contiguous axial images were obtained from the base of the skull through the vertex without intravenous contrast. COMPARISON:  06/10/2016 FINDINGS: Brain: No evidence of acute infarction, hemorrhage, hydrocephalus, extra-axial collection or mass lesion/mass effect. Vascular: No hyperdense vessel or unexpected calcification. Skull: Normal. Negative for fracture or focal lesion. Sinuses/Orbits: No acute finding. Other: None. IMPRESSION: No acute intracranial pathology. Electronically Signed   By: Eddie Candle M.D.   On: 08/03/2018 21:19   Dg Chest Port 1 View  Result Date: 08/03/2018 CLINICAL DATA:  63 year old female with confusion, shortness of breath and fatigue for 2 days. Fever. Negative for COVID-19. Today. EXAM: PORTABLE CHEST 1 VIEW COMPARISON:  Chest radiographs 05/04/2016 and earlier. FINDINGS: Portable AP semi upright view at 2029 hours. Lower lung volumes. Normal cardiac size and mediastinal contours. Visualized tracheal air column is within normal limits. Allowing for portable technique the lungs are clear. No pneumothorax. Paucity of bowel gas in the upper abdomen. No acute osseous abnormality identified. IMPRESSION: Lower lung volumes.  No acute cardiopulmonary abnormality. Electronically Signed   By: Genevie Ann M.D.   On: 08/03/2018 21:23    Procedures Procedures (including critical care time)  Medications Ordered in ED Medications  sodium chloride flush (NS) 0.9 % injection 3 mL (3 mLs Intravenous Given 08/03/18 1949)  acetaminophen (TYLENOL) tablet 650  mg (650 mg Oral Given 08/03/18 1958)  lactated ringers bolus 1,000 mL (1,000 mLs Intravenous New Bag/Given 08/03/18 2042)     Initial Impression / Assessment and Plan / ED Course  I have reviewed the triage vital signs and the nursing notes.  Pertinent labs & imaging results that were available during my care of the patient were reviewed by me and considered in my medical decision making (see chart for details).       Patient presenting today with 2 days of mild altered mental status in the setting of fever up to 103 that patient was unaware of.  She has no focal symptoms such as chest pain, coughing, abdominal pain or urinary symptoms.  Abdomen is benign and low suspicion for an acute abdominal process causing the fever.  Will check a urine but doubt that that is her source.  Patient has had some shortness of breath when she tries to get up and walk but denies cough.  We will get a chest x-ray to ensure no evidence of pneumonia.  Also concerned that patient may have dehydration as she has not been eating and drinking normally.  Given her change in mental status has no focal findings on exam but will do head CT to ensure no other acute issues.  Patient has full range of motion of her neck and low suspicion for meningitis at this time.  She has no history concerning for tick exposure [facility] spotted fever.  She has been out and about in her community and this could be COVID.  She was made contact precaution and airborne precaution and COVID test pending.  Lactic acid is within normal limits, CMP shows mild AKI based on her baseline, mild hypokalemia of 3.2 blood sugar in the 100s without evidence of DKA today with a normal anion gap.  CBC with normal white count and hemoglobin.  Patient given IV fluids.  Imaging pending.  10:42 PM Patient's CT of the head and chest x-ray without acute findings.  UA is consistent with a UTI with greater than 50 white blood cells, many bacteria and white blood cells  are clumped in the urine.  Urine culture was sent and patient was started on Keflex.  COVID testing here initially was negative given patient has another cause for her symptoms do not feel that she needs further COVID testing at this time.  Patient feels significantly better after fever control and IV fluids.  Discussed with patient and her family pushing fluids at home and continuing the antibiotic.  They will return if her symptoms worsen.  Final Clinical Impressions(s) / ED Diagnoses   Final diagnoses:  Dehydration  Acute cystitis without hematuria    ED Discharge Orders         Ordered    cephALEXin (KEFLEX) 500 MG capsule  4 times daily     08/03/18 2238           Gwyneth SproutPlunkett, Mystery Schrupp, MD 08/03/18 2243

## 2018-08-03 NOTE — ED Triage Notes (Signed)
Confusion x 2 days. Sob and fatigue.

## 2018-08-05 ENCOUNTER — Encounter (HOSPITAL_COMMUNITY): Payer: Self-pay

## 2018-08-05 ENCOUNTER — Emergency Department (HOSPITAL_COMMUNITY): Payer: BC Managed Care – PPO

## 2018-08-05 ENCOUNTER — Inpatient Hospital Stay (HOSPITAL_COMMUNITY)
Admission: EM | Admit: 2018-08-05 | Discharge: 2018-08-08 | DRG: 871 | Disposition: A | Discharge status: Home / Self Care | Payer: BC Managed Care – PPO | Attending: Internal Medicine | Admitting: Internal Medicine

## 2018-08-05 ENCOUNTER — Inpatient Hospital Stay (HOSPITAL_COMMUNITY): Payer: BC Managed Care – PPO

## 2018-08-05 ENCOUNTER — Other Ambulatory Visit: Payer: Self-pay

## 2018-08-05 DIAGNOSIS — E785 Hyperlipidemia, unspecified: Secondary | ICD-10-CM | POA: Diagnosis present

## 2018-08-05 DIAGNOSIS — E1165 Type 2 diabetes mellitus with hyperglycemia: Secondary | ICD-10-CM | POA: Diagnosis not present

## 2018-08-05 DIAGNOSIS — I9589 Other hypotension: Secondary | ICD-10-CM | POA: Diagnosis not present

## 2018-08-05 DIAGNOSIS — Z8249 Family history of ischemic heart disease and other diseases of the circulatory system: Secondary | ICD-10-CM | POA: Diagnosis not present

## 2018-08-05 DIAGNOSIS — A4189 Other specified sepsis: Secondary | ICD-10-CM | POA: Diagnosis not present

## 2018-08-05 DIAGNOSIS — R197 Diarrhea, unspecified: Secondary | ICD-10-CM | POA: Diagnosis present

## 2018-08-05 DIAGNOSIS — E11649 Type 2 diabetes mellitus with hypoglycemia without coma: Secondary | ICD-10-CM | POA: Diagnosis present

## 2018-08-05 DIAGNOSIS — G9341 Metabolic encephalopathy: Secondary | ICD-10-CM | POA: Diagnosis present

## 2018-08-05 DIAGNOSIS — U071 COVID-19: Secondary | ICD-10-CM | POA: Diagnosis present

## 2018-08-05 DIAGNOSIS — Z794 Long term (current) use of insulin: Secondary | ICD-10-CM

## 2018-08-05 DIAGNOSIS — Z87891 Personal history of nicotine dependence: Secondary | ICD-10-CM | POA: Diagnosis not present

## 2018-08-05 DIAGNOSIS — I10 Essential (primary) hypertension: Secondary | ICD-10-CM | POA: Diagnosis not present

## 2018-08-05 DIAGNOSIS — A09 Infectious gastroenteritis and colitis, unspecified: Secondary | ICD-10-CM

## 2018-08-05 DIAGNOSIS — E876 Hypokalemia: Secondary | ICD-10-CM | POA: Diagnosis not present

## 2018-08-05 DIAGNOSIS — E861 Hypovolemia: Secondary | ICD-10-CM

## 2018-08-05 DIAGNOSIS — Z888 Allergy status to other drugs, medicaments and biological substances status: Secondary | ICD-10-CM

## 2018-08-05 DIAGNOSIS — E119 Type 2 diabetes mellitus without complications: Secondary | ICD-10-CM

## 2018-08-05 DIAGNOSIS — E860 Dehydration: Secondary | ICD-10-CM | POA: Diagnosis present

## 2018-08-05 DIAGNOSIS — Z79899 Other long term (current) drug therapy: Secondary | ICD-10-CM | POA: Diagnosis not present

## 2018-08-05 DIAGNOSIS — N390 Urinary tract infection, site not specified: Secondary | ICD-10-CM | POA: Diagnosis present

## 2018-08-05 DIAGNOSIS — R6520 Severe sepsis without septic shock: Secondary | ICD-10-CM | POA: Diagnosis not present

## 2018-08-05 DIAGNOSIS — Z86718 Personal history of other venous thrombosis and embolism: Secondary | ICD-10-CM

## 2018-08-05 DIAGNOSIS — A419 Sepsis, unspecified organism: Secondary | ICD-10-CM

## 2018-08-05 LAB — COMPREHENSIVE METABOLIC PANEL
ALT: 23 U/L (ref 0–44)
AST: 49 U/L — ABNORMAL HIGH (ref 15–41)
Albumin: 3.1 g/dL — ABNORMAL LOW (ref 3.5–5.0)
Alkaline Phosphatase: 47 U/L (ref 38–126)
Anion gap: 8 (ref 5–15)
BUN: 28 mg/dL — ABNORMAL HIGH (ref 8–23)
CO2: 29 mmol/L (ref 22–32)
Calcium: 8.6 mg/dL — ABNORMAL LOW (ref 8.9–10.3)
Chloride: 99 mmol/L (ref 98–111)
Creatinine, Ser: 1.27 mg/dL — ABNORMAL HIGH (ref 0.44–1.00)
GFR calc Af Amer: 52 mL/min — ABNORMAL LOW (ref >60)
GFR calc non Af Amer: 45 mL/min — ABNORMAL LOW (ref >60)
Glucose, Bld: 52 mg/dL — ABNORMAL LOW (ref 70–99)
Potassium: 3.2 mmol/L — ABNORMAL LOW (ref 3.5–5.1)
Sodium: 136 mmol/L (ref 135–145)
Total Bilirubin: 0.6 mg/dL (ref 0.3–1.2)
Total Protein: 7.1 g/dL (ref 6.5–8.1)

## 2018-08-05 LAB — CBC WITH DIFFERENTIAL/PLATELET
Abs Immature Granulocytes: 0.02 10*3/uL (ref 0.00–0.07)
Basophils Absolute: 0 10*3/uL (ref 0.0–0.1)
Basophils Relative: 0 %
Eosinophils Absolute: 0 10*3/uL (ref 0.0–0.5)
Eosinophils Relative: 0 %
HCT: 39.3 % (ref 36.0–46.0)
Hemoglobin: 12.7 g/dL (ref 12.0–15.0)
Immature Granulocytes: 0 %
Lymphocytes Relative: 38 %
Lymphs Abs: 1.7 10*3/uL (ref 0.7–4.0)
MCH: 26.9 pg (ref 26.0–34.0)
MCHC: 32.3 g/dL (ref 30.0–36.0)
MCV: 83.3 fL (ref 80.0–100.0)
Monocytes Absolute: 0.2 10*3/uL (ref 0.1–1.0)
Monocytes Relative: 5 %
Neutro Abs: 2.6 10*3/uL (ref 1.7–7.7)
Neutrophils Relative %: 57 %
Platelets: 142 10*3/uL — ABNORMAL LOW (ref 150–400)
RBC: 4.72 MIL/uL (ref 3.87–5.11)
RDW: 13 % (ref 11.5–15.5)
WBC: 4.5 10*3/uL (ref 4.0–10.5)
nRBC: 0 % (ref 0.0–0.2)

## 2018-08-05 LAB — CBG MONITORING, ED
Glucose-Capillary: 55 mg/dL — ABNORMAL LOW (ref 70–99)
Glucose-Capillary: 83 mg/dL (ref 70–99)
Glucose-Capillary: 179 mg/dL — ABNORMAL HIGH (ref 70–99)

## 2018-08-05 LAB — CK: Total CK: 156 U/L (ref 38–234)

## 2018-08-05 LAB — URINALYSIS, ROUTINE W REFLEX MICROSCOPIC
Bilirubin Urine: NEGATIVE
Glucose, UA: >=500 mg/dL — AB
Ketones, ur: NEGATIVE mg/dL
Leukocytes,Ua: NEGATIVE
Nitrite: NEGATIVE
Protein, ur: >=300 mg/dL — AB
Specific Gravity, Urine: 1.011 (ref 1.005–1.030)
pH: 5 (ref 5.0–8.0)

## 2018-08-05 LAB — SARS CORONAVIRUS 2 BY RT PCR (HOSPITAL ORDER, PERFORMED IN ~~LOC~~ HOSPITAL LAB): SARS Coronavirus 2: POSITIVE — AB

## 2018-08-05 LAB — LACTIC ACID, PLASMA: Lactic Acid, Venous: 1.5 mmol/L (ref 0.5–1.9)

## 2018-08-05 LAB — C-REACTIVE PROTEIN: CRP: 1.3 mg/dL — ABNORMAL HIGH (ref <1.0)

## 2018-08-05 LAB — FERRITIN: Ferritin: 285 ng/mL (ref 11–307)

## 2018-08-05 MED ORDER — SODIUM CHLORIDE 0.9 % IV SOLN
1.0000 g | INTRAVENOUS | Status: DC
  Administered 2018-08-05 – 2018-08-07 (×3): 1 g via INTRAVENOUS
  Filled 2018-08-05 (×3): qty 10

## 2018-08-05 MED ORDER — INSULIN ASPART 100 UNIT/ML ~~LOC~~ SOLN: 0.0000 [IU] | SUBCUTANEOUS | Status: DC

## 2018-08-05 MED ORDER — SODIUM CHLORIDE 0.9% FLUSH
3.0000 mL | Freq: Two times a day (BID) | INTRAVENOUS | Status: DC
  Administered 2018-08-06 – 2018-08-08 (×6): 3 mL via INTRAVENOUS

## 2018-08-05 MED ORDER — LACTATED RINGERS IV BOLUS (SEPSIS)
1000.0000 mL | Freq: Once | INTRAVENOUS | Status: AC
  Administered 2018-08-05: 1000 mL via INTRAVENOUS

## 2018-08-05 MED ORDER — POTASSIUM CHLORIDE CRYS ER 20 MEQ PO TBCR
40.0000 meq | EXTENDED_RELEASE_TABLET | Freq: Once | ORAL | Status: AC
  Administered 2018-08-06: 40 meq via ORAL
  Filled 2018-08-05: qty 2

## 2018-08-05 MED ORDER — SODIUM CHLORIDE (PF) 0.9 % IJ SOLN
INTRAMUSCULAR | Status: AC
  Filled 2018-08-05: qty 50

## 2018-08-05 MED ORDER — DEXTROSE 10 % IV SOLN
100.0000 mL | Freq: Once | INTRAVENOUS | Status: AC
  Administered 2018-08-05: 100 mL via INTRAVENOUS
  Filled 2018-08-05: qty 500

## 2018-08-05 MED ORDER — LACTATED RINGERS IV BOLUS: 1000.0000 mL | Freq: Once | INTRAVENOUS | Status: DC

## 2018-08-05 MED ORDER — SODIUM CHLORIDE 0.9% FLUSH: 3.0000 mL | INTRAVENOUS | Status: DC | PRN

## 2018-08-05 MED ORDER — SODIUM CHLORIDE 0.9 % IV SOLN
500.0000 mg | Freq: Once | INTRAVENOUS | Status: AC
  Administered 2018-08-05: 500 mg via INTRAVENOUS
  Filled 2018-08-05: qty 500

## 2018-08-05 MED ORDER — IOHEXOL 350 MG/ML SOLN
100.0000 mL | Freq: Once | INTRAVENOUS | Status: AC | PRN
  Administered 2018-08-05: 100 mL via INTRAVENOUS

## 2018-08-05 MED ORDER — ACETAMINOPHEN 325 MG PO TABS
650.0000 mg | ORAL_TABLET | Freq: Once | ORAL | Status: AC
  Administered 2018-08-05: 650 mg via ORAL
  Filled 2018-08-05: qty 2

## 2018-08-05 MED ORDER — LACTATED RINGERS IV BOLUS (SEPSIS)
500.0000 mL | Freq: Once | INTRAVENOUS | Status: AC
  Administered 2018-08-05: 500 mL via INTRAVENOUS

## 2018-08-05 NOTE — ED Notes (Signed)
Date and time results received: 08/05/18  7:55 PM    Test:COVID positive Critical Value:  Name of Provider Notified:[place]  Orders Received? Or Actions Taken?:

## 2018-08-05 NOTE — H&P (Addendum)
Lori StallMadelyn D Slaydon ZOX:[id number] DOB: 1955-08-31 DOA: 08/05/2018     PCP: Burnis MedinFulbright, Virginia E, PA-C   Outpatient Specialists:   CARDS  Dr. Ottis StainHuff, Benay SpiceJason Douglas, MD    Endocrinology Yetta BarreJones, Retta DionesAutumn Sharie, [place] JerseyPA-C    Patient arrived to ER on 08/05/18 at 1614  Patient coming from: home Lives alone,       Chief Complaint:  Chief Complaint  Patient presents with   Fever   Altered Mental Status    HPI: Lori Crane is a 63 y.o. 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  She has had decreased appetite lately reports diarrhea this AM   When endorsed any chest pain no coughing no abdominal point no nausea no vomiting patient [facility] was found to be febrile not aware that she was running a fever satting 93% on room air Patient was diagnosed with UTI given IV fluids And confusion head CT was done structure showed no signs of infection COVID was done and was negative left patient was started on Keflex and discharged to home Followed up with her primary care provider today but still continued to not feel well and her symptoms have not improved despite Keflex.  She still unable to tolerate good p.o. and still somewhat confused. At that point her primary care provider asked her to present to emergency department States last time she took antibiotics was yesterday  Hypertension was poorly controlled in the past and she required admission for hypertensive urgency in 2018  Last time she has seen her mother who is [age] last week She has been around her cousin who is a nurse for the past 1 week  She has been her son too  She is retired, has been going grocery shopping   She has not taken her insulin shot  today   Infectious risk factors:  Reports  fever, shortness of breath  In  ER RAPID COVID TEST   POSITIVE,     Regarding pertinent Chronic problems:   DVT was on Xarelto not anymore since last week now on aspirin 81   Hyperlipidemia -  on statins Zocor   HTN on metoprolol, micardis    DM 2 -  Lab Results  Component Value Date   HGBA1C (H) 07/13/2008    6.2 (NOTE) The ADA recommends the following therapeutic goal for glycemic control related to Hgb A1c measurement: Goal of therapy: <6.5 Hgb A1c  Reference: American Diabetes Association: Clinical Practice Recommendations 2010, Diabetes Care, 2010, 33: (Suppl  1).   on insulin and PO meds ,     While in ER: Patient was started on Rocephin and given IV fluids  Noted to be hypotensive in the emergency department with systolics down to 80% maps around 64-65   The following Work up has been ordered so far:  Orders Placed This Encounter  Procedures   Critical Care   Blood Culture (routine x 2)   Urine culture   SARS Coronavirus 2 ([facility]ID - [facility]   DG Chest [place]   CT Angio Chest PE W and/or Wo Contrast   Comprehensive metabolic panel   CBC WITH DIFFERENTIAL   Urinalysis, Routine w reflex microscopic  CK   HIV antibody (Routine Testing)   C-reactive protein   D-dimer, quantitative (not at [facility])   Ferritin   Fibrinogen   Interleukin-6, Plasma   Lactate dehydrogenase   Procalcitonin   Sedimentation rate   Troponin I (High Sensitivity)   Strep pneumoniae urinary antigen   Diet Carb Modified Fluid consistency: Thin; Room service appropriate? Yes   Cardiac monitoring   Refer to Sidebar Report: Sepsis Bundle ED/IP   Document vital signs within 1-hour of fluid bolus completion and notify provider of bolus completion   Document Actual / Estimated Weight   Insert peripheral IV x 2   Initiate Carrier Fluid Protocol   Offer Juice and Food    Give 4 oz juice or regular soda   Novel Coronavirus PPE supplies (droplet and contact precautions) yellow stethoscopes, surgical mask, gowns, surgical caps, face shield, goggles, CAPR - on the floor/unit, cleaning Sani-Cloth (orange and purple top)   Place COVID-19 isolation sign and PPE checklist outside the DOOR   Do not give nonsteroidal anti-inflammatory drugs (NSAIDs)   Patient to wear surgical mask during transportation   Place working phone next to the patient   Initiate Oral Care Protocol   Initiate Carrier Fluid Protocol   Cardiac Monitoring - Continuous Indefinite   Cardiac monitoring   Call Code Sepsis ([facility] [phone]) Reason for Consult? tracking   Consult to hospitalist  ALL PATIENTS BEING ADMITTED/HAVING PROCEDURES NEED COVID-19 SCREENING   Consult to hospitalist  ALL PATIENTS BEING ADMITTED/HAVING PROCEDURES NEED COVID-19 SCREENING   Airborne and Contact precautions   Pulse oximetry, continuous   POC CBG, ED   CBG monitoring, ED (now and then every hour for 3 hours)   ED EKG 12-Lead   EKG 12-Lead   Insert saline lock   Admit to Inpatient (patient's expected length of stay will be greater than 2 midnights or inpatient only procedure)     Following Medications were ordered in ER: Medications  cefTRIAXone (ROCEPHIN) 1 g in sodium chloride 0.9 % 100 mL IVPB (0 g Intravenous Stopped 08/05/18 1811)  sodium chloride flush (NS) 0.9 % injection 3 mL (has no administration in time range)  sodium chloride flush (NS) 0.9 % injection 3 mL (has no administration in time range)  sodium chloride (PF) 0.9 % injection (has no administration in time range)  potassium chloride SA (K-DUR) CR tablet 40 mEq (has no administration in time range)  acetaminophen (TYLENOL) tablet 650 mg (650 mg Oral Given 08/05/18 1752)  lactated ringers bolus 1,000 mL (0 mLs Intravenous Stopped 08/05/18 2027)    And  lactated ringers bolus 1,000 mL (0 mLs Intravenous Stopped 08/05/18 2027)     And  lactated ringers bolus 500 mL (0 mLs Intravenous Stopped 08/05/18 2027)  azithromycin (ZITHROMAX) 500 mg in sodium chloride 0.9 % 250 mL IVPB (0 mg Intravenous Stopped 08/05/18 1959)  dextrose 10 % infusion (0 mLs Intravenous Stopped 08/05/18 1959)  iohexol (OMNIPAQUE) 350 MG/ML injection 100 mL (100 mLs Intravenous Contrast Given 08/05/18 2134)        Consult Orders  (From admission, onward)         Start     Ordered   08/05/18 1922  Consult to hospitalist  ALL PATIENTS BEING ADMITTED/HAVING PROCEDURES NEED COVID-19 SCREENING  Once    Comments: ALL PATIENTS BEING ADMITTED/HAVING PROCEDURES NEED COVID-19 SCREENING  Provider:  (Not yet assigned)  Question Answer Comment  Place call to: [facility]   Reason for Consult Admit   Diagnosis/Clinical Info  for Consult: no call back first time      08/05/18 1921   08/05/18 1838  Consult to hospitalist  Will page Dr. Toniann FailKakrakandy  @ 1900. EPD aware  Once    Comments: ALL PATIENTS BEING ADMITTED/HAVING PROCEDURES NEED COVID-19 SCREENING  Provider:  (Not yet assigned)  Question Answer Comment  Place call to: [facility]   Reason for Consult Admit      08/05/18 1837           Significant initial  Findings: Abnormal Labs Reviewed  SARS CORONAVIRUS 2 (HOSPITAL ORDER, PERFORMED IN [facility]) - Abnormal; Notable for the following components:      Result Value   SARS Coronavirus 2 POSITIVE (*)    All other components within normal limits  COMPREHENSIVE METABOLIC PANEL - Abnormal; Notable for the following components:   Potassium 3.2 (*)    Glucose, Bld 52 (*)    BUN 28 (*)    Creatinine, Ser 1.27 (*)    Calcium 8.6 (*)    Albumin 3.1 (*)    AST 49 (*)    GFR calc non Af Amer 45 (*)    GFR calc Af Amer 52 (*)    All other components within normal limits  CBC WITH DIFFERENTIAL/PLATELET - Abnormal; Notable for the following components:   Platelets 142 (*)    All other components within normal limits   URINALYSIS, ROUTINE W REFLEX MICROSCOPIC - Abnormal; Notable for the following components:   APPearance HAZY (*)    Glucose, UA >=500 (*)    Hgb urine dipstick SMALL (*)    Protein, ur >=300 (*)    Bacteria, UA MANY (*)    All other components within normal limits  C-REACTIVE PROTEIN - Abnormal; Notable for the following components:   CRP 1.3 (*)    All other components within normal limits  CBG MONITORING, ED - Abnormal; Notable for the following components:   Glucose-Capillary 55 (*)    All other components within normal limits  CBG MONITORING, ED - Abnormal; Notable for the following components:   Glucose-Capillary 179 (*)    All other components within normal limits     Otherwise labs showing:    Recent Labs  Lab 08/03/18 1953 08/05/18 1743  NA 132* 136  K 3.2* 3.2*  CO2 27 29  GLUCOSE 141* 52*  BUN 23 28*  CREATININE 1.08* 1.27*  CALCIUM 8.9 8.6*    Cr ,  Up from baseline see below Lab Results  Component Value Date   CREATININE 1.27 (H) 08/05/2018   CREATININE 1.08 (H) 08/03/2018   CREATININE 0.69 06/13/2016    Recent Labs  Lab 08/03/18 1953 08/05/18 1743  AST 51* 49*  ALT 28 23  ALKPHOS 55 47  BILITOT 0.5 0.6  PROT 7.8 7.1  ALBUMIN 3.5 3.1*   Lab Results  Component Value Date   CALCIUM 8.6 (L) 08/05/2018     WBC      Component Value Date/Time   WBC 4.5 08/05/2018 1743   ANC    Component Value Date/Time   NEUTROABS 2.6 08/05/2018 1743      Plt: Lab Results  Component Value Date   PLT 142 (L) 08/05/2018     Lactic Acid, Venous    Component Value Date/Time   LATICACIDVEN 1.5 08/05/2018 1743       COVID-19 Labs  Recent Labs    08/05/18 2027  FERRITIN 285  CRP 1.3*    Lab Results  Component Value Date   SARSCOV2NAA POSITIVE (A) 08/05/2018   SARSCOV2NAA NEGATIVE 08/03/2018     HG/HCT stable,     Component Value Date/Time   HGB 12.7 08/05/2018 1743   HCT 39.3 08/05/2018 1743       Cardiac Panel (last 3  results) Recent Labs    08/05/18 1743  CKTOTAL 156        CBG (last 3)  Recent Labs    08/05/18 1827 08/05/18 1856 08/05/18 2008  GLUCAP 55* 83 179*       UA   evidence of UTI  From 2 days ago   Urine analysis:    Component Value Date/Time   COLORURINE YELLOW 08/05/2018 1727   APPEARANCEUR HAZY (A) 08/05/2018 1727   LABSPEC 1.011 08/05/2018 1727   PHURINE 5.0 08/05/2018 1727   GLUCOSEU >=500 (A) 08/05/2018 1727   HGBUR SMALL (A) 08/05/2018 1727   BILIRUBINUR NEGATIVE 08/05/2018 1727   KETONESUR NEGATIVE 08/05/2018 1727   PROTEINUR >=300 (A) 08/05/2018 1727   UROBILINOGEN 1.0 07/09/2013 1356   NITRITE NEGATIVE 08/05/2018 1727   LEUKOCYTESUR NEGATIVE 08/05/2018 1727     CXR - developing infiltrate???   CTA chest - ordered   ECG:  Personally reviewed by me showing: HR : 74 Rhythm:  NSR   no evidence of ischemic changes QTC 445      ED Triage Vitals [08/05/18 1633]  Enc Vitals Group     BP (!) 90/55     Pulse Rate 79     Resp 14     Temp (!) 100.6 F (38.1 C)     Temp Source Oral     SpO2 95 %     Weight      Height      Head Circumference      Peak Flow      Pain Score 0     Pain Loc      Pain Edu?      Excl. in GC?   ZOXW(96)@TMAX(24)@       Latest  Blood pressure (!) 92/58, pulse 74, temperature 98.7 F (37.1 C), temperature source Oral, resp. rate 16, SpO2 94 %.     Hospitalist was called for admission for    Review of Systems:    Pertinent positives include:  Fevers, chills, fatigue, diarrhea, loss of appetite Body aches Constitutional:  No weight loss, night sweatsweight loss  HEENT:  No headaches, Difficulty swallowing,Tooth/dental problems,Sore throat,  No sneezing, itching, ear ache, nasal congestion, post nasal drip,  Cardio-vascular:  No chest pain, Orthopnea, PND, anasarca, dizziness, palpitations.no Bilateral lower extremity swelling  GI:  No heartburn, indigestion, abdominal pain, nausea, vomiting, change in bowel habits, ,  melena, blood in stool, hematemesis Resp:  no shortness of breath at rest. No dyspnea on exertion, No excess mucus, no productive cough, No non-productive cough, No coughing up of blood.No change in color of mucus.No wheezing. Skin:  no rash or lesions. No jaundice GU:  no dysuria, change in color of urine, no urgency or frequency. No straining to urinate.  No flank pain.  Musculoskeletal:  No joint pain or no joint swelling. No decreased range of motion. No back pain.  Psych:  No change in mood or affect. No depression or anxiety. No memory loss.  Neuro: no localizing neurological complaints, no tingling, no weakness, no double vision, no gait abnormality, no slurred speech, no confusion  All systems reviewed and apart from HOPI all are negative  Past Medical History:  Past Medical History:  Diagnosis Date   Diabetes mellitus without complication (HCC)    Hypercholesterolemia    Hypertension       Past Surgical History:  Procedure Laterality Date   ARTERY BIOPSY Left 06/13/2016   Procedure: LEFT TEMPORAL ARTERY BIOPSY;  Surgeon: Maeola Harman, MD;  Location: [facility] OR;  Service: Vascular;  Laterality: Left;    Social History:  Ambulatory   independently      reports that she has quit smoking. She has quit using smokeless tobacco. She reports previous alcohol use. She reports that she does not use drugs.     Family History:   Family History  Problem Relation Age of Onset   Hypertension Other     Allergies: Allergies  Allergen Reactions   Metformin And Related Other (See Comments)    Severe Yeast infection     Prior to Admission medications   Medication Sig Start Date End Date Taking? Authorizing Provider  cephALEXin (KEFLEX) 500 MG capsule Take 1 capsule (500 mg total) by mouth 4 (four) times daily. 08/03/18  Yes Plunkett, Alphonzo Lemmings, MD  cloNIDine (CATAPRES) 0.1 MG tablet Take 0.1 mg by mouth 2 (two) times daily. 06/26/18  Yes [provider]  DULoxetine (CYMBALTA) 30 MG capsule Take 30 mg by mouth daily. 06/11/18  Yes [provider]  empagliflozin (JARDIANCE) 25 MG TABS tablet Take 25 mg by mouth daily.  04/30/16  Yes [provider]  fluconazole (DIFLUCAN) 150 MG tablet Take 150 mg by mouth See admin instructions. Take 1 at onset. May repeat on day 3. 07/31/18  Yes [provider]  gabapentin (NEURONTIN) 300 MG capsule Take 300 mg by mouth 3 (three) times daily as needed (pain).  04/08/16  Yes [provider]  glimepiride (AMARYL) 4 MG tablet Take 4 mg by mouth 2 (two) times a day. 02/02/18  Yes [provider]  hydrALAZINE (APRESOLINE) 50 MG tablet Take 1 tablet (50 mg total) by mouth every 8 (eight) hours. 06/14/16  Yes Tyrone Nine, MD  HYDROcodone-acetaminophen (NORCO/VICODIN) 5-325 MG tablet Take 1 tablet by mouth every 4 (four) hours as needed. 02/27/17  Yes Upstill, Shari, PA-C  insulin degludec (TRESIBA) 100 UNIT/ML SOPN FlexTouch Pen Inject 50 Units into the skin daily. 02/02/18  Yes [provider]  methocarbamol (ROBAXIN) 500 MG tablet Take 1 tablet (500 mg total) by mouth 2 (two) times daily. 02/27/17  Yes Upstill, Melvenia Beam, PA-C  metoprolol (LOPRESSOR) 50 MG tablet Take 1 tablet (50 mg total) by mouth 2 (two) times daily. 06/14/16  Yes Tyrone Nine, MD  simvastatin (ZOCOR) 20 MG tablet Take 20 mg by mouth every evening.    Yes [provider]  telmisartan-hydrochlorothiazide (MICARDIS HCT) 80-25 MG per tablet Take 1 tablet by mouth daily.    Yes [provider]  ondansetron (ZOFRAN) 4 MG tablet Take 1 tablet (4 mg total) by mouth every 6 (six) hours. Patient not taking: Reported on 06/07/2016 07/09/13   Phillis Haggis, MD  oxyCODONE-acetaminophen (PERCOCET/ROXICET) 5-325 MG per tablet Take 1-2 tablets by mouth every 6 (six) hours as needed for severe pain. Patient not taking: Reported on 06/07/2016 07/09/13   Phillis Haggis, MD  predniSONE (DELTASONE) 20 MG tablet  Take 3 tablets (60 mg total) by mouth daily with breakfast. Patient not taking: Reported on 08/05/2018 06/15/16   Tyrone Nine, MD  rivaroxaban (XARELTO) 20 MG TABS tablet Take 20 mg by mouth daily with supper.  05/23/16 05/23/17  [provider]  sitaGLIPtin-metformin (JANUMET) 50-1000 MG tablet Take 1 tablet by mouth 2 (two) times daily with a meal. Patient not taking: Reported on 08/05/2018 06/07/16   Rolland Porter, MD   Physical Exam: Blood pressure (!) 92/58, pulse 74, temperature 98.7 F (37.1 C), temperature source Oral, resp. rate 16, SpO2 94 %. 1. General:  in No  Acute distress   Chronically ill -appearing 2. Psychological: Alert and  Oriented 3. Head/ENT:     Dry Mucous Membranes                          Head Non traumatic, neck supple                          Poor Dentition 4. SKIN:  decreased Skin turgor,  Skin clean Dry and intact no rash 5. Heart: Regular rate and rhythm no  Murmur, no Rub or gallop 6. Lungs:  Clear to auscultation bilaterally, no wheezes or crackles   7. Abdomen: Soft,    non-tender, distended   obese  bowel sounds present 8. Lower extremities: no clubbing, cyanosis, no edema 9. Neurologically Grossly intact, moving all 4 extremities equally  10. MSK: Normal range of motion   All other LABS:     Recent Labs  Lab 08/03/18 1953 08/05/18 1743  WBC 4.4 4.5  NEUTROABS 2.7 2.6  HGB 13.7 12.7  HCT 42.3 39.3  MCV 82.0 83.3  PLT 131* 142*     Recent Labs  Lab 08/03/18 1953 08/05/18 1743  NA 132* 136  K 3.2* 3.2*  CL 94* 99  CO2 27 29  GLUCOSE 141* 52*  BUN 23 28*  CREATININE 1.08* 1.27*  CALCIUM 8.9 8.6*     Recent Labs  Lab 08/03/18 1953 08/05/18 1743  AST 51* 49*  ALT 28 23  ALKPHOS 55 47  BILITOT 0.5 0.6  PROT 7.8 7.1  ALBUMIN 3.5 3.1*       Cultures: No results found for: SDES, SPECREQUEST, CULT, REPTSTATUS   Radiological Exams on Admission: Ct Angio Chest Pe W And/or Wo Contrast  Result Date: 08/05/2018 CLINICAL DATA:   63 year old female with COVID-19, increased confusion. EXAM: CT ANGIOGRAPHY CHEST WITH CONTRAST TECHNIQUE: Multidetector CT imaging of the chest was performed using the standard protocol during bolus administration of intravenous contrast. Multiplanar CT image reconstructions and MIPs were obtained to evaluate the vascular anatomy. CONTRAST:  OMNIPAQUE IOHEXOL 350 MG/ML SOLN COMPARISON:  Portable chest earlier today. CT Abdomen and Pelvis 07/09/2013. FINDINGS: Cardiovascular: Good contrast bolus timing in the pulmonary arterial tree. Mild respiratory motion, especially in the lower lobes. No focal filling defect identified in the pulmonary arteries to suggest acute pulmonary embolism. Calcified aortic atherosclerosis. Mild cardiomegaly. No pericardial effusion. Mediastinum/Nodes: Negative, no mediastinal lymphadenopathy. Lungs/Pleura: Major airways are patent. There is mild scattered bilateral pulmonary ground-glass opacity, 1 of the more pronounced areas is at the posterior inferior right upper lobe on series 10, image 62. Lung volumes are also lower, so some of the lower lobe ground-glass opacity is probably superimposed atelectasis. No pleural effusion or consolidation. Upper Abdomen: Negative visible liver, spleen, pancreas, adrenal glands and bowel. Possible small gastric hiatal hernia. Stable partially visible right renal cortical scarring. Musculoskeletal: No acute osseous abnormality identified. Review of the MIP images confirms the above findings. IMPRESSION: 1.  Negative for acute pulmonary embolus. 2. Atelectasis with superimposed scattered bilateral ground-glass opacity compatible with COVID-19 pneumonia.  No pleural effusion. 3.  Aortic Atherosclerosis (ICD10-I70.0). Electronically Signed   By: Odessa Fleming M.D.   On: 08/05/2018 21:53   Dg Chest Port 1 View  Result Date: 08/05/2018 CLINICAL DATA:  Fever EXAM: PORTABLE CHEST 1 VIEW COMPARISON:  August 03, 2018 FINDINGS: There is a somewhat indistinct  right heart border. No pneumothorax. No large pleural effusion. There is some blunting of the left costophrenic angle. There is no acute osseous abnormality. The heart size is stable from prior study. Lung volumes remain somewhat low. IMPRESSION: 1. Somewhat indistinct right heart border in comparison to prior study. Findings could represent a developing infiltrate or atelectasis. 2. Low lung volumes. Left basilar airspace opacities are favored to represent atelectasis, however an infiltrate is not excluded. Electronically Signed   By: Katherine Mantle M.D.   On: 08/05/2018 18:12    Chart has been reviewed    Assessment/Plan  63 y.o. 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, HTN,  HLD,   Admitted for COVID 19 infection  Present on Admission:  COVID-19 virus infection-  Initial Rapid Novel Corona Virus testing:  Ordered 08/05/18 and is  positive    - As per hx pt with evidence of fever   shortness of breath  GI symptoms:  Severe fatigue    CXR: abnormal       -Following work-up initiated:          sputum cultures Ordered 08/05/18, Blood cultures  Ordered 08/05/18,   Following complications noted:     evidence of AKI - will provide gentle rehydration   will be monitoring for evidence of other complications such as PE/DVT CVA or  cardiovascular events  Plan of treatment: - Transfer to [facility] facility if positive and beds are available    - Will follow daily d.dimer - Assess for ability to prone  - Supportive management -Fluid sparing resuscitation  -Provide oxygen as needed currently on  RA  SpO2: 96 % - IF d.dimer elvated >5 will increase dose of lovenox   - Consult PCCM if becomes respiratory unstable   Poor Prognostic factors  63 y.o.  Personal hx of  DM2,  HTN, obesity I     Airborne precautions ordered     Will order Airborne and Contact precautions    patient prognosis discussion:   patient to be full  code      Acute lower UTI -  - treat with Rocephin        await results of urine culture and adjust antibiotic coverage as needed   Uncontrolled hypertension-currently hypotensive allow permissive hypertension hold home medications Diarrhea secondary to COVID supportive management will rehydrate  Diabetes mellitus with hypoglycemia while in ER. Hold home medications and home insulin and continue to monitor blood sugar order sliding sensitive scale as blood sugar currently increasing  DVT in the past recently stopped Xarelto she completed her course continue Lovenox for prophylaxis Other plan as per orders.  DVT prophylaxis:    Lovenox     Code Status:  FULL CODE  as per patient  I had personally discussed CODE STATUS with patient   Family Communication:   Family not at  Bedside    Disposition Plan:     To home once workup is complete and patient is stable                      Consults called: none   Admission status:  ED Disposition    ED Disposition Condition Comment   Admit  [facility] [[id number]]  Level of Care: Progressive [102]  Covid Evaluation: Confirmed COVID Positive  Diagnosis: COVID-19 virus infection [[id number]]  Admitting Physician: Therisa Doyne [[id number]]  Attending Physician: Therisa Doyne [[id number]]  Estimated length of stay: 3 - 4 days  Certification:: I certify this patient will need inpatient services for at least 2 midnights  PT Class (Do Not Modify): Inpatient [101]  PT Acc Code (Do Not Modify): Private [1]          inpatient     Expect 2 midnight stay secondary to severity of patient's current illness including   hemodynamic instability despite optimal treatment ( hypotension  Hypoxia )  Severe lab/radiological/exam abnormalities including:  hypoglycemia   and extensive comorbidities including:  DM2  That are currently affecting medical management.   I expect  patient to be hospitalized for 2 midnights  requiring inpatient medical care.  Patient is at high risk for adverse outcome (such as loss of life or disability) if not treated.  Indication for inpatient stay as follows:   change from baseline regarding mental status Hemodynamic instability despite maximal medical therapy,    inability to maintain oral hydration     Need for IV antibiotics, IV fluids,      Level of care       SDU tele indefinitely please discontinue once patient no longer qualifies  Precautions:  airborne Airborne and Contact precautions  PPE: Used by the provider:   P100  eye Goggles,  Gloves  gown      Donie Lemelin 08/05/2018, 10:07 PM    [facility]     after 2 AM please page floor coverage PA If 7AM-7PM, please contact the day team taking care of the patient using [email or web address]

## 2018-08-05 NOTE — Progress Notes (Signed)
Patient arrived to [facility] via [facility]. Ambulated from stretcher to bed. Patient oriented to environment. Call bed within reach. VSS - see flowsheets for assessment. 2 PIV present. Patient on room air. Multiple gold rings on patient's fingers.   On call MD paged regarding admission.  Relative Rise Paganini called and notified of arrival. Patient also spoke with [place] at that time. Will continue to closely monitor patient.

## 2018-08-05 NOTE — ED Triage Notes (Signed)
Pt sent by PCP. Pt states that she was seen 2 days ago at [facility]. Pt has increased confusion. Pt was told that she had a UTI, and symptoms have not improved in 2 days with PO meds. PCp sent her here for fluids and IV ABX. Pt states she does not have her normal appetite.  A&Ox4 with this RN  Pt has no complaints of pain.

## 2018-08-05 NOTE — ED Notes (Signed)
[facility] called. 

## 2018-08-05 NOTE — ED Provider Notes (Signed)
[facility]-EMERGENCY DEPT Provider Note   CSN: [id number] Arrival date & time: 08/05/18  1614   LEVEL 5 CAVEAT - ALTERED MENTAL STATUS   History   Chief Complaint Chief Complaint  Patient presents with  . Fever  . Altered Mental Status    HPI Campbell StallMadelyn D Feely is a 63 y.o. female.     HPI  63 year old female presents with confusion. History is mostly from cousin, Penelope GalasBeverley Wilson. She states the patient has been more and more confused since leaving the hospital.  She seems unsteady but has not fallen since leaving.  Has not been eating and drinking very well.  Did not take her antibiotics this morning but did take them yesterday.  She has been confused about certain things like her phone or phone numbers.  The patient states she is here for a "checkup" and that she was recently tested for a UTI and COVID.  Past Medical History:  Diagnosis Date  . Diabetes mellitus without complication (HCC)   . Hypercholesterolemia   . Hypertension     Patient Active Problem List   Diagnosis Date Noted  . COVID-19 virus infection 08/05/2018  . Acute lower UTI 08/05/2018  . Temporal arteritis (HCC)   . Headache 06/11/2016  . Uncontrolled hypertension 06/11/2016  . Diabetes mellitus type 2, controlled (HCC) 06/11/2016  . History of DVT of lower extremity 06/11/2016  . Hypertensive urgency     Past Surgical History:  Procedure Laterality Date  . ARTERY BIOPSY Left 06/13/2016   Procedure: LEFT TEMPORAL ARTERY BIOPSY;  Surgeon: Maeola Harmanain, Brandon Christopher, MD;  [facility] OR;  Service: Vascular;  Laterality: Left;     OB History   No obstetric history on file.      Home Medications    Prior to Admission medications   Medication Sig Start Date End Date Taking? Authorizing Provider  cephALEXin (KEFLEX) 500 MG capsule Take 1 capsule (500 mg total) by mouth 4 (four) times daily. 08/03/18  Yes Plunkett, Alphonzo LemmingsWhitney, MD  cloNIDine (CATAPRES) 0.1 MG tablet Take 0.1 mg by  mouth 2 (two) times daily. 06/26/18  Yes [provider]  DULoxetine (CYMBALTA) 30 MG capsule Take 30 mg by mouth daily. 06/11/18  Yes [provider]  empagliflozin (JARDIANCE) 25 MG TABS tablet Take 25 mg by mouth daily.  04/30/16  Yes [provider]  fluconazole (DIFLUCAN) 150 MG tablet Take 150 mg by mouth See admin instructions. Take 1 at onset. May repeat on day 3. 07/31/18  Yes [provider]  gabapentin (NEURONTIN) 300 MG capsule Take 300 mg by mouth 3 (three) times daily as needed (pain).  04/08/16  Yes [provider]  glimepiride (AMARYL) 4 MG tablet Take 4 mg by mouth 2 (two) times a day. 02/02/18  Yes [provider]  hydrALAZINE (APRESOLINE) 50 MG tablet Take 1 tablet (50 mg total) by mouth every 8 (eight) hours. 06/14/16  Yes Tyrone NineGrunz, Ryan B, MD  HYDROcodone-acetaminophen (NORCO/VICODIN) 5-325 MG tablet Take 1 tablet by mouth every 4 (four) hours as needed. 02/27/17  Yes Upstill, Shari, PA-C  insulin degludec (TRESIBA) 100 UNIT/ML SOPN FlexTouch Pen Inject 50 Units into the skin daily. 02/02/18  Yes [provider]  methocarbamol (ROBAXIN) 500 MG tablet Take 1 tablet (500 mg total) by mouth 2 (two) times daily. 02/27/17  Yes Upstill, Melvenia BeamShari, PA-C  metoprolol (LOPRESSOR) 50 MG tablet Take 1 tablet (50 mg total) by mouth 2 (two) times daily. 06/14/16  Yes Hazeline JunkerGrunz, Ryan  B, MD  simvastatin (ZOCOR) 20 MG tablet Take 20 mg by mouth every evening.    Yes [provider]  telmisartan-hydrochlorothiazide (MICARDIS HCT) 80-25 MG per tablet Take 1 tablet by mouth daily.    Yes [provider]  ondansetron (ZOFRAN) 4 MG tablet Take 1 tablet (4 mg total) by mouth every 6 (six) hours. Patient not taking: Reported on 06/07/2016 07/09/13   Phillis HaggisMabe, Martha L, MD  oxyCODONE-acetaminophen (PERCOCET/ROXICET) 5-325 MG per tablet Take 1-2 tablets by mouth every 6 (six) hours as needed for severe pain. Patient not taking: Reported on 06/07/2016 07/09/13    Phillis HaggisMabe, Martha L, MD  predniSONE (DELTASONE) 20 MG tablet Take 3 tablets (60 mg total) by mouth daily with breakfast. Patient not taking: Reported on 08/05/2018 06/15/16   Tyrone NineGrunz, Ryan B, MD  rivaroxaban (XARELTO) 20 MG TABS tablet Take 20 mg by mouth daily with supper.  05/23/16 05/23/17  [provider]  sitaGLIPtin-metformin (JANUMET) 50-1000 MG tablet Take 1 tablet by mouth 2 (two) times daily with a meal. Patient not taking: Reported on 08/05/2018 06/07/16   Rolland PorterJames, Mark, MD    Family History Family History  Problem Relation Age of Onset  . Hypertension Other     Social History Social History   Tobacco Use  . Smoking status: Former Games developermoker  . Smokeless tobacco: Former Engineer, waterUser  Substance Use Topics  . Alcohol use: Not Currently    Comment: socially   . Drug use: No     Allergies   Metformin and related   Review of Systems Review of Systems  Unable to perform ROS: Mental status change     Physical Exam Updated Vital Signs BP (!) 114/57   Pulse 82   Temp 98.7 F (37.1 C) (Oral)   Resp (!) 23   SpO2 93%   Physical Exam Vitals signs and nursing note reviewed.  Constitutional:      Appearance: She is well-developed.  HENT:     Head: Normocephalic and atraumatic.     Right Ear: External ear normal.     Left Ear: External ear normal.     Nose: Nose normal.  Eyes:     General:        Right eye: No discharge.        Left eye: No discharge.  Cardiovascular:     Rate and Rhythm: Normal rate and regular rhythm.     Heart sounds: Normal heart sounds.  Pulmonary:     Effort: Pulmonary effort is normal. No tachypnea or accessory muscle usage.     Breath sounds: Examination of the right-lower field reveals rales. Rales present.  Abdominal:     General: There is no distension.     Palpations: Abdomen is soft.     Tenderness: There is no abdominal tenderness. There is no right CVA tenderness or left CVA tenderness.  Skin:    General: Skin is warm and dry.   Neurological:     Mental Status: She is alert.  Psychiatric:        Mood and Affect: Mood is not anxious.      ED Treatments / Results  Labs (all labs ordered are listed, but only abnormal results are displayed) Labs Reviewed  SARS CORONAVIRUS 2 (HOSPITAL ORDER, PERFORMED IN [facility]) - Abnormal; Notable for the following components:      Result Value   SARS Coronavirus 2 POSITIVE (*)    All other components within normal limits  COMPREHENSIVE METABOLIC PANEL -  Abnormal; Notable for the following components:   Potassium 3.2 (*)    Glucose, Bld 52 (*)    BUN 28 (*)    Creatinine, Ser 1.27 (*)    Calcium 8.6 (*)    Albumin 3.1 (*)    AST 49 (*)    GFR calc non Af Amer 45 (*)    GFR calc Af Amer 52 (*)    All other components within normal limits  CBC WITH DIFFERENTIAL/PLATELET - Abnormal; Notable for the following components:   Platelets 142 (*)    All other components within normal limits  URINALYSIS, ROUTINE W REFLEX MICROSCOPIC - Abnormal; Notable for the following components:   APPearance HAZY (*)    Glucose, UA >=500 (*)    Hgb urine dipstick SMALL (*)    Protein, ur >=300 (*)    Bacteria, UA MANY (*)    All other components within normal limits  C-REACTIVE PROTEIN - Abnormal; Notable for the following components:   CRP 1.3 (*)    All other components within normal limits  CBG MONITORING, ED - Abnormal; Notable for the following components:   Glucose-Capillary 55 (*)    All other components within normal limits  CBG MONITORING, ED - Abnormal; Notable for the following components:   Glucose-Capillary 179 (*)    All other components within normal limits  CULTURE, BLOOD (ROUTINE X 2)  CULTURE, BLOOD (ROUTINE X 2)  URINE CULTURE  LACTIC ACID, PLASMA  CK  FERRITIN  HIV ANTIBODY (ROUTINE TESTING W REFLEX)  D-DIMER, QUANTITATIVE (NOT AT [facility])  FIBRINOGEN  INTERLEUKIN-6, PLASMA  LACTATE DEHYDROGENASE  PROCALCITONIN  SEDIMENTATION RATE  TROPONIN I  (HIGH SENSITIVITY)  TROPONIN I (HIGH SENSITIVITY)  STREP PNEUMONIAE URINARY ANTIGEN  CBG MONITORING, ED  CBG MONITORING, ED  CBG MONITORING, ED    EKG EKG Interpretation  Date/Time:  Wednesday August 05 2018 18:16:15 EDT Ventricular Rate:  74 PR Interval:    QRS Duration: 97 QT Interval:  401 QTC Calculation: 445 R Axis:   6 Text Interpretation:  Sinus rhythm nonspecific ST/T waves no significant change since 2018 Confirmed by Sherwood Gambler ([id number]) on 08/05/2018 6:39:55 PM   Radiology Dg Chest Port 1 View  Result Date: 08/05/2018 CLINICAL DATA:  Fever EXAM: PORTABLE CHEST 1 VIEW COMPARISON:  August 03, 2018 FINDINGS: There is a somewhat indistinct right heart border. No pneumothorax. No large pleural effusion. There is some blunting of the left costophrenic angle. There is no acute osseous abnormality. The heart size is stable from prior study. Lung volumes remain somewhat low. IMPRESSION: 1. Somewhat indistinct right heart border in comparison to prior study. Findings could represent a developing infiltrate or atelectasis. 2. Low lung volumes. Left basilar airspace opacities are favored to represent atelectasis, however an infiltrate is not excluded. Electronically Signed   By: Constance Holster M.D.   On: 08/05/2018 18:12    Procedures .Critical Care Performed by: Sherwood Gambler, MD Authorized by: Sherwood Gambler, MD   Critical care provider statement:    Critical care time (minutes):  35   Critical care time was exclusive of:  Separately billable procedures and treating other patients   Critical care was necessary to treat or prevent imminent or life-threatening deterioration of the following conditions:  Respiratory failure and sepsis   Critical care was time spent personally by me on the following activities:  Discussions with consultants, evaluation of patient's response to treatment, examination of patient, ordering and performing treatments and interventions, ordering and  review of  laboratory studies, ordering and review of radiographic studies, pulse oximetry, re-evaluation of patient's condition, obtaining history from patient or surrogate and review of old charts   (including critical care time)  Medications Ordered in ED Medications  cefTRIAXone (ROCEPHIN) 1 g in sodium chloride 0.9 % 100 mL IVPB (0 g Intravenous Stopped 08/05/18 1811)  sodium chloride flush (NS) 0.9 % injection 3 mL (has no administration in time range)  sodium chloride flush (NS) 0.9 % injection 3 mL (has no administration in time range)  sodium chloride (PF) 0.9 % injection (has no administration in time range)  potassium chloride SA (K-DUR) CR tablet 40 mEq (has no administration in time range)  acetaminophen (TYLENOL) tablet 650 mg (650 mg Oral Given 08/05/18 1752)  lactated ringers bolus 1,000 mL (0 mLs Intravenous Stopped 08/05/18 2027)    And  lactated ringers bolus 1,000 mL (0 mLs Intravenous Stopped 08/05/18 2027)    And  lactated ringers bolus 500 mL (0 mLs Intravenous Stopped 08/05/18 2027)  azithromycin (ZITHROMAX) 500 mg in sodium chloride 0.9 % 250 mL IVPB (0 mg Intravenous Stopped 08/05/18 1959)  dextrose 10 % infusion (0 mLs Intravenous Stopped 08/05/18 1959)     Initial Impression / Assessment and Plan / ED Course  I have reviewed the triage vital signs and the nursing notes.  Pertinent labs & imaging results that were available during my care of the patient were reviewed by me and considered in my medical decision making (see chart for details).  Clinical Course as of Aug 04 2124  Wed Aug 05, 2018  1743 The last few blood pressures while I was in the room were in the 80s systolic.  MAP was around 64 or 65 but I think with the downtrend she will get 30 cc/KG IV fluids for code sepsis.   [SG]  1842 Patient placement will page Dr. Toniann FailKakrakandy at 7 pm. Otherwise, will continue resuscitation of hypoglycemia and hypotension   [SG]  1949 I discussed with Dr. Adela Glimpseoutova, she asked for CT  angiography given report of shortness of breath during her last ER visit and her other symptoms.  She will admit.   [SG]    Clinical Course User Index [SG] Pricilla LovelessGoldston, Lonell Stamos, MD       Blood pressure has come up with IV fluids.  She was treated per sepsis protocol.  Her COVID-19 has come back positive.  Continue with CT angiography per hospitalist.  Otherwise, she will need supportive care and I think she should still get antibiotics for what is probably a UTI based on urinalysis.  She just had a head CT a couple days ago that was unremarkable and given no new falls I do not think emergent repeat is needed tonight.  Kynzleigh D Portier was evaluated in Emergency Department on 08/05/2018 for the symptoms described in the history of present illness. She was evaluated in the context of the global COVID-19 pandemic, which necessitated consideration that the patient might be at risk for infection with the SARS-CoV-2 virus that causes COVID-19. Institutional protocols and algorithms that pertain to the evaluation of patients at risk for COVID-19 are in a state of rapid change based on information released by regulatory bodies including the CDC and federal and state organizations. These policies and algorithms were followed during the patient's care in the ED.   Final Clinical Impressions(s) / ED Diagnoses   Final diagnoses:  Severe sepsis (HCC)  COVID-19 virus infection  Acute UTI    ED Discharge Orders  None       Pricilla Loveless, MD 08/05/18 2127

## 2018-08-05 NOTE — ED Notes (Signed)
ED TO INPATIENT HANDOFF REPORT  Name/Age/Gender Lori Crane 63 y.o. female  Code Status Code Status History    Date Active Date Inactive Code Status Order ID Comments User Context   06/11/2016 0438 06/14/2016 1534 Full Code [id number]  Eduard ClosKakrakandy, Arshad N, MD ED   Advance Care Planning Activity      Home/SNF/Other Home  Chief Complaint Fever, Diabetic issues, sent by dr  Level of Care/Admitting Diagnosis ED Disposition    ED Disposition Condition Comment   Admit  [facility] [[id number]]  Level of Care: Progressive [102]  Covid Evaluation: Confirmed COVID Positive  Diagnosis: COVID-19 virus infection [[id number]][[phone]]  Admitting Physician: Therisa DoyneUTOVA, ANASTASSIA [[id number]]  Attending Physician: Therisa DoyneUTOVA, ANASTASSIA [[id number]]  Estimated length of stay: 3 - 4 days  Certification:: I certify this patient will need inpatient services for at least 2 midnights  PT Class (Do Not Modify): Inpatient [101]  PT Acc Code (Do Not Modify): Private [1]       Medical History Past Medical History:  Diagnosis Date  . Diabetes mellitus without complication (HCC)   . Hypercholesterolemia   . Hypertension     Allergies Allergies  Allergen Reactions  . Metformin And Related Other (See Comments)    Severe Yeast infection    IV Location/Drains/Wounds Patient Lines/Drains/Airways Status   Active Line/Drains/Airways    Name:   Placement date:   Placement time:   Site:   Days:   Peripheral IV 08/05/18 Right Forearm   08/05/18    1744    Forearm   less than 1   Peripheral IV 08/05/18 Left Antecubital   08/05/18    1854    Antecubital   less than 1   Incision (Closed) 06/13/16 Head Left   06/13/16    1241     783          Labs/Imaging Results for orders placed or performed during the hospital encounter of 08/05/18 (from the past 48 hour(s))  Urinalysis, Routine w reflex microscopic     Status: Abnormal   Collection Time: 08/05/18  5:27 PM  Result Value Ref  Range   Color, Urine YELLOW YELLOW   APPearance HAZY (A) CLEAR   Specific Gravity, Urine 1.011 1.005 - 1.030   pH 5.0 5.0 - 8.0   Glucose, UA >=500 (A) NEGATIVE mg/dL   Hgb urine dipstick SMALL (A) NEGATIVE   Bilirubin Urine NEGATIVE NEGATIVE   Ketones, ur NEGATIVE NEGATIVE mg/dL   Protein, ur >=272>=300 (A) NEGATIVE mg/dL   Nitrite NEGATIVE NEGATIVE   Leukocytes,Ua NEGATIVE NEGATIVE   RBC / HPF 0-5 0 - 5 RBC/hpf   WBC, UA 6-10 0 - 5 WBC/hpf   Bacteria, UA MANY (A) NONE SEEN   Squamous Epithelial / LPF 0-5 0 - 5   Mucus PRESENT    Hyaline Casts, UA PRESENT     Comment: Performed at [facility], [address] [place], [place] [address]  SARS Coronavirus 2 (CEPHEID - Performed in [facility] hospital lab), Hosp Order     Status: Abnormal   Collection Time: 08/05/18  5:27 PM   Specimen: Nasopharyngeal Swab  Result Value Ref Range   SARS Coronavirus 2 POSITIVE (A) NEGATIVE    Comment: RESULT CALLED TO, READ BACK BY AND VERIFIED WITH: BULLOCK,A RN @1952  ON 08/05/2018 JACKSON,K (NOTE) If result is NEGATIVE SARS-CoV-2 target nucleic acids are NOT DETECTED. The SARS-CoV-2 RNA is generally detectable in upper and lower  respiratory specimens during the  acute phase of infection. The lowest  concentration of SARS-CoV-2 viral copies this assay can detect is 250  copies / mL. A negative result does not preclude SARS-CoV-2 infection  and should not be used as the sole basis for treatment or other  patient management decisions.  A negative result may occur with  improper specimen collection / handling, submission of specimen other  than nasopharyngeal swab, presence of viral mutation(s) within the  areas targeted by this assay, and inadequate number of viral copies  (<250 copies / mL). A negative result must be combined with clinical  observations, patient history, and epidemiological information. If result is POSITIVE SARS-CoV-2 target nucleic acids are DETECTE D. The  SARS-CoV-2 RNA is generally detectable in upper and lower  respiratory specimens during the acute phase of infection.  Positive  results are indicative of active infection with SARS-CoV-2.  Clinical  correlation with patient history and other diagnostic information is  necessary to determine patient infection status.  Positive results do  not rule out bacterial infection or co-infection with other viruses. If result is PRESUMPTIVE POSTIVE SARS-CoV-2 nucleic acids MAY BE PRESENT.   A presumptive positive result was obtained on the submitted specimen  and confirmed on repeat testing.  While 2019 novel coronavirus  (SARS-CoV-2) nucleic acids may be present in the submitted sample  additional confirmatory testing may be necessary for epidemiological  and / or clinical management purposes  to differentiate between  SARS-CoV-2 and other Sarbecovirus currently known to infect humans.  If clinically indicated additional testing with an alternate test  methodology (LAB745 3) is advised. The SARS-CoV-2 RNA is generally  detectable in upper and lower respiratory specimens during the acute  phase of infection. The expected result is Negative. Fact Sheet for Patients:  BoilerBrush.com.cyhttps://www.fda.gov/media/136312/download Fact Sheet for Healthcare Providers: https://pope.com/https://www.fda.gov/media/136313/download This test is not yet approved or cleared by the [place] FDA and has been authorized for detection and/or diagnosis of SARS-CoV-2 by FDA under an Emergency Use Authorization (EUA).  This EUA will remain in effect (meaning this test can be used) for the duration of the COVID-19 declaration under Section 564(b)(1) of the Act, 21 U.S.C. section 360bbb-3(b)(1), unless the authorization is terminated or revoked sooner. Performed at [facility], [address] [place], [place] [address]   Lactic acid, plasma     Status: None   Collection Time: 08/05/18  5:43 PM  Result Value Ref Range    Lactic Acid, Venous 1.5 0.5 - 1.9 mmol/L    Comment: Performed at [facility], [address] [place], [place] [address]  Comprehensive metabolic panel     Status: Abnormal   Collection Time: 08/05/18  5:43 PM  Result Value Ref Range   Sodium 136 135 - 145 mmol/L   Potassium 3.2 (L) 3.5 - 5.1 mmol/L   Chloride 99 98 - 111 mmol/L   CO2 29 22 - 32 mmol/L   Glucose, Bld 52 (L) 70 - 99 mg/dL   BUN 28 (H) 8 - 23 mg/dL   Creatinine, Ser 0.981.27 (H) 0.44 - 1.00 mg/dL   Calcium 8.6 (L) 8.9 - 10.3 mg/dL   Total Protein 7.1 6.5 - 8.1 g/dL   Albumin 3.1 (L) 3.5 - 5.0 g/dL   AST 49 (H) 15 - 41 U/L   ALT 23 0 - 44 U/L   Alkaline Phosphatase 47 38 - 126 U/L   Total Bilirubin 0.6 0.3 - 1.2 mg/dL   GFR calc non Af Amer 45 (  L) >60 mL/min   GFR calc Af Amer 52 (L) >60 mL/min   Anion gap 8 5 - 15    Comment: Performed at [facility], [address] [place], [place] [address]  CBC WITH DIFFERENTIAL     Status: Abnormal   Collection Time: 08/05/18  5:43 PM  Result Value Ref Range   WBC 4.5 4.0 - 10.5 K/uL   RBC 4.72 3.87 - 5.11 MIL/uL   Hemoglobin 12.7 12.0 - 15.0 g/dL   HCT 98.139.3 19.136.0 - 47.846.0 %   MCV 83.3 80.0 - 100.0 fL   MCH 26.9 26.0 - 34.0 pg   MCHC 32.3 30.0 - 36.0 g/dL   RDW 29.513.0 62.111.5 - 30.815.5 %   Platelets 142 (L) 150 - 400 K/uL   nRBC 0.0 0.0 - 0.2 %   Neutrophils Relative % 57 %   Neutro Abs 2.6 1.7 - 7.7 K/uL   Lymphocytes Relative 38 %   Lymphs Abs 1.7 0.7 - 4.0 K/uL   Monocytes Relative 5 %   Monocytes Absolute 0.2 0.1 - 1.0 K/uL   Eosinophils Relative 0 %   Eosinophils Absolute 0.0 0.0 - 0.5 K/uL   Basophils Relative 0 %   Basophils Absolute 0.0 0.0 - 0.1 K/uL   Immature Granulocytes 0 %   Abs Immature Granulocytes 0.02 0.00 - 0.07 K/uL    Comment: Performed at [facility], [address] [place], [place] [address]  CK     Status: None   Collection Time: 08/05/18  5:43 PM  Result Value Ref Range   Total CK 156 38 -  234 U/L    Comment: Performed at [facility], [address] [place], [place] [address]  CBG monitoring, ED     Status: Abnormal   Collection Time: 08/05/18  6:27 PM  Result Value Ref Range   Glucose-Capillary 55 (L) 70 - 99 mg/dL  POC CBG, ED     Status: None   Collection Time: 08/05/18  6:56 PM  Result Value Ref Range   Glucose-Capillary 83 70 - 99 mg/dL  CBG monitoring, ED (now and then every hour for 3 hours)     Status: Abnormal   Collection Time: 08/05/18  8:08 PM  Result Value Ref Range   Glucose-Capillary 179 (H) 70 - 99 mg/dL  C-reactive protein     Status: Abnormal   Collection Time: 08/05/18  8:27 PM  Result Value Ref Range   CRP 1.3 (H) <1.0 mg/dL    Comment: Performed at [facility], [address] [place], [place] [address]  Ferritin     Status: None   Collection Time: 08/05/18  8:27 PM  Result Value Ref Range   Ferritin 285 11 - 307 ng/mL    Comment: Performed at [facility], [address] [place], [place] [address]   Dg Chest Port 1 View  Result Date: 08/05/2018 CLINICAL DATA:  Fever EXAM: PORTABLE CHEST 1 VIEW COMPARISON:  August 03, 2018 FINDINGS: There is a somewhat indistinct right heart border. No pneumothorax. No large pleural effusion. There is some blunting of the left costophrenic angle. There is no acute osseous abnormality. The heart size is stable from prior study. Lung volumes remain somewhat low. IMPRESSION: 1. Somewhat indistinct right heart border in comparison to prior study. Findings could represent a developing infiltrate or atelectasis. 2. Low lung volumes. Left basilar airspace opacities are favored to represent atelectasis, however an infiltrate is not  excluded. Electronically Signed   By: Constance Holster M.D.   On: 08/05/2018 18:12    Pending Labs Unresulted Labs (From admission, onward)    Start     Ordered   08/06/18 0500  HIV antibody (Routine Testing)  Tomorrow morning,   AD      08/05/18 2030   08/05/18 2030  Troponin I (High Sensitivity)  STAT Now then every 2 hours,   STAT     08/05/18 2030   08/05/18 2030  Strep pneumoniae urinary antigen  Once,   STAT     08/05/18 2030   08/05/18 2027  D-dimer, quantitative (not at [facility])  Add-on,   AD     08/05/18 2030   08/05/18 2027  Fibrinogen  Add-on,   AD     08/05/18 2030   08/05/18 2027  Interleukin-6, Plasma  Add-on,   AD     08/05/18 2030   08/05/18 2027  Lactate dehydrogenase  Add-on,   AD     08/05/18 2030   08/05/18 2027  Procalcitonin  Add-on,   AD     08/05/18 2030   08/05/18 2027  Sedimentation rate  Add-on,   AD     08/05/18 2030   08/05/18 1727  Blood Culture (routine x 2)  BLOOD CULTURE X 2,   STAT     08/05/18 1726   08/05/18 1727  Urine culture  ONCE - STAT,   STAT     08/05/18 1726          Vitals/Pain Today's Vitals   08/05/18 1900 08/05/18 1930 08/05/18 1945 08/05/18 2000  BP: (!) 92/51 (!) 111/59 (!) 107/59 (!) 114/57  Pulse: 79 86 83 82  Resp: (!) 23 (!) 24 19 (!) 23  Temp:      TempSrc:      SpO2: 94% 92% (!) 89% 93%  PainSc:        Isolation Precautions Airborne and Contact precautions  Medications Medications  cefTRIAXone (ROCEPHIN) 1 g in sodium chloride 0.9 % 100 mL IVPB (0 g Intravenous Stopped 08/05/18 1811)  sodium chloride flush (NS) 0.9 % injection 3 mL (has no administration in time range)  sodium chloride flush (NS) 0.9 % injection 3 mL (has no administration in time range)  sodium chloride (PF) 0.9 % injection (has no administration in time range)  potassium chloride SA (K-DUR) CR tablet 40 mEq (has no administration in time range)  iohexol (OMNIPAQUE) 350 MG/ML injection 100 mL (has no administration in time range)  acetaminophen (TYLENOL) tablet 650 mg (650 mg Oral Given 08/05/18 1752)  lactated ringers bolus 1,000 mL (0 mLs Intravenous Stopped 08/05/18 2027)    And  lactated ringers bolus 1,000 mL (0 mLs Intravenous Stopped 08/05/18 2027)    And  lactated ringers  bolus 500 mL (0 mLs Intravenous Stopped 08/05/18 2027)  azithromycin (ZITHROMAX) 500 mg in sodium chloride 0.9 % 250 mL IVPB (0 mg Intravenous Stopped 08/05/18 1959)  dextrose 10 % infusion (0 mLs Intravenous Stopped 08/05/18 1959)    Mobility walks

## 2018-08-06 LAB — CBC WITH DIFFERENTIAL/PLATELET
Abs Immature Granulocytes: 0.02 10*3/uL (ref 0.00–0.07)
Basophils Absolute: 0 10*3/uL (ref 0.0–0.1)
Basophils Relative: 0 %
Eosinophils Absolute: 0 10*3/uL (ref 0.0–0.5)
Eosinophils Relative: 0 %
HCT: 36.6 % (ref 36.0–46.0)
Hemoglobin: 11.7 g/dL — ABNORMAL LOW (ref 12.0–15.0)
Immature Granulocytes: 1 %
Lymphocytes Relative: 25 %
Lymphs Abs: 0.8 10*3/uL (ref 0.7–4.0)
MCH: 26.5 pg (ref 26.0–34.0)
MCHC: 32 g/dL (ref 30.0–36.0)
MCV: 82.8 fL (ref 80.0–100.0)
Monocytes Absolute: 0.2 10*3/uL (ref 0.1–1.0)
Monocytes Relative: 5 %
Neutro Abs: 2.2 10*3/uL (ref 1.7–7.7)
Neutrophils Relative %: 69 %
Platelets: 130 10*3/uL — ABNORMAL LOW (ref 150–400)
RBC: 4.42 MIL/uL (ref 3.87–5.11)
RDW: 12.9 % (ref 11.5–15.5)
WBC: 3.2 10*3/uL — ABNORMAL LOW (ref 4.0–10.5)
nRBC: 0 % (ref 0.0–0.2)

## 2018-08-06 LAB — LACTATE DEHYDROGENASE: LDH: 193 U/L — ABNORMAL HIGH (ref 98–192)

## 2018-08-06 LAB — URINE CULTURE: Culture: NO GROWTH

## 2018-08-06 LAB — D-DIMER, QUANTITATIVE: D-Dimer, Quant: 0.45 ug/mL-FEU (ref 0.00–0.50)

## 2018-08-06 LAB — COMPREHENSIVE METABOLIC PANEL
ALT: 19 U/L (ref 0–44)
AST: 40 U/L (ref 15–41)
Albumin: 2.7 g/dL — ABNORMAL LOW (ref 3.5–5.0)
Alkaline Phosphatase: 42 U/L (ref 38–126)
Anion gap: 9 (ref 5–15)
BUN: 17 mg/dL (ref 8–23)
CO2: 28 mmol/L (ref 22–32)
Calcium: 8.5 mg/dL — ABNORMAL LOW (ref 8.9–10.3)
Chloride: 103 mmol/L (ref 98–111)
Creatinine, Ser: 0.84 mg/dL (ref 0.44–1.00)
GFR calc Af Amer: >60 mL/min (ref >60)
GFR calc non Af Amer: >60 mL/min (ref >60)
Glucose, Bld: 65 mg/dL — ABNORMAL LOW (ref 70–99)
Potassium: 3 mmol/L — ABNORMAL LOW (ref 3.5–5.1)
Sodium: 140 mmol/L (ref 135–145)
Total Bilirubin: 0.1 mg/dL — ABNORMAL LOW (ref 0.3–1.2)
Total Protein: 6.6 g/dL (ref 6.5–8.1)

## 2018-08-06 LAB — C-REACTIVE PROTEIN: CRP: 1.5 mg/dL — ABNORMAL HIGH (ref <1.0)

## 2018-08-06 LAB — HEMOGLOBIN A1C
Hgb A1c MFr Bld: 13 % — ABNORMAL HIGH (ref 4.8–5.6)
Mean Plasma Glucose: 326.4 mg/dL

## 2018-08-06 LAB — GLUCOSE, CAPILLARY
Glucose-Capillary: 62 mg/dL — ABNORMAL LOW (ref 70–99)
Glucose-Capillary: 104 mg/dL — ABNORMAL HIGH (ref 70–99)
Glucose-Capillary: 84 mg/dL (ref 70–99)
Glucose-Capillary: 90 mg/dL (ref 70–99)

## 2018-08-06 LAB — FERRITIN: Ferritin: 271 ng/mL (ref 11–307)

## 2018-08-06 LAB — TROPONIN I (HIGH SENSITIVITY): Troponin I (High Sensitivity): 15 ng/L (ref <18)

## 2018-08-06 LAB — HIV ANTIBODY (ROUTINE TESTING W REFLEX): HIV Screen 4th Generation wRfx: NONREACTIVE

## 2018-08-06 LAB — STREP PNEUMONIAE URINARY ANTIGEN: Strep Pneumo Urinary Antigen: NEGATIVE

## 2018-08-06 LAB — PROCALCITONIN: Procalcitonin: <0.1 ng/mL

## 2018-08-06 LAB — SEDIMENTATION RATE: Sed Rate: 57 mm/hr — ABNORMAL HIGH (ref 0–22)

## 2018-08-06 LAB — FIBRINOGEN: Fibrinogen: 538 mg/dL — ABNORMAL HIGH (ref 210–475)

## 2018-08-06 MED ORDER — ACETAMINOPHEN 325 MG PO TABS
650.0000 mg | ORAL_TABLET | Freq: Four times a day (QID) | ORAL | Status: DC | PRN
  Administered 2018-08-06: 650 mg via ORAL
  Filled 2018-08-06: qty 2

## 2018-08-06 MED ORDER — ENOXAPARIN SODIUM 40 MG/0.4ML ~~LOC~~ SOLN
40.0000 mg | Freq: Every day | SUBCUTANEOUS | Status: DC
  Administered 2018-08-06 – 2018-08-07 (×2): 40 mg via SUBCUTANEOUS
  Filled 2018-08-06 (×3): qty 0.4

## 2018-08-06 MED ORDER — GABAPENTIN 600 MG PO TABS
300.0000 mg | ORAL_TABLET | Freq: Three times a day (TID) | ORAL | Status: DC | PRN
  Filled 2018-08-06: qty 0.5

## 2018-08-06 MED ORDER — INSULIN ASPART 100 UNIT/ML ~~LOC~~ SOLN
0.0000 [IU] | Freq: Three times a day (TID) | SUBCUTANEOUS | Status: DC
  Administered 2018-08-07: 5 [IU] via SUBCUTANEOUS
  Administered 2018-08-07: 2 [IU] via SUBCUTANEOUS
  Administered 2018-08-07: 1 [IU] via SUBCUTANEOUS
  Administered 2018-08-08: 7 [IU] via SUBCUTANEOUS

## 2018-08-06 MED ORDER — METOPROLOL TARTRATE 25 MG PO TABS
25.0000 mg | ORAL_TABLET | Freq: Two times a day (BID) | ORAL | Status: DC
  Administered 2018-08-06 – 2018-08-08 (×4): 25 mg via ORAL
  Filled 2018-08-06 (×5): qty 1

## 2018-08-06 MED ORDER — SIMVASTATIN 20 MG PO TABS
20.0000 mg | ORAL_TABLET | Freq: Every day | ORAL | Status: DC
  Administered 2018-08-06 – 2018-08-07 (×3): 20 mg via ORAL
  Filled 2018-08-06 (×4): qty 1

## 2018-08-06 MED ORDER — GABAPENTIN 300 MG PO CAPS
300.0000 mg | ORAL_CAPSULE | Freq: Three times a day (TID) | ORAL | Status: DC | PRN
  Administered 2018-08-06 – 2018-08-07 (×3): 300 mg via ORAL
  Filled 2018-08-06 (×3): qty 1

## 2018-08-06 NOTE — Progress Notes (Signed)
Inpatient Diabetes Program Recommendations  AACE/ADA: New Consensus Statement on Inpatient Glycemic Control (2015)  Target Ranges:  Prepandial:   less than 140 mg/dL      Peak postprandial:   less than 180 mg/dL (1-2 hours)      Critically ill patients:  140 - 180 mg/dL   Lab Results  Component Value Date   GLUCAP 104 (H) 08/06/2018   HGBA1C 13.0 (H) 08/06/2018    Review of Glycemic Control Results for KEONDRA, HAYDU (MRN [id number]) as of 08/06/2018 13:21  Ref. Range 08/05/2018 18:27 08/05/2018 18:56 08/05/2018 20:08 08/06/2018 07:49 08/06/2018 11:02  Glucose-Capillary Latest Ref Range: 70 - 99 mg/dL 55 (L) 83 179 (H) 62 (L) 104 (H)   Diabetes history: DM 2 Outpatient Diabetes medications:  Janumet 50/1000 mg bid, Jardiance 25 mg daily, Amaryl 4 mg bid, Tresiba 50 units daily, Prednisone 60 mg with breakfast Current orders for Inpatient glycemic control:  Novolog sensitive tid with meals  Inpatient Diabetes Program Recommendations:    Note low blood sugars.  Patient was in ED with UTI on 08/02/18.  Patient is on Jardiance for DM which can increase risk of UTI's.  Currently blood sugars are <100 mg/dL however A1C is 13%.  Will follow.  Thanks  Adah Perl, RN, BC-ADM Inpatient Diabetes Coordinator Pager [phone] (8a-5p)

## 2018-08-06 NOTE — Progress Notes (Addendum)
PROGRESS NOTE                                                                                                                                                                                                             Patient Demographics:    Lori Crane, is a 63 y.o. female, DOB - [birth year], ZOX:[id number]  Admit date - 08/05/2018   Admitting Physician Therisa DoyneAnastassia Doutova, MD  Outpatient Primary MD for the patient is [place]aver[place]bright, [place] E, PA-C  LOS - 1   Chief Complaint  Patient presents with   Fever   Altered Mental Status       Brief Narrative     63 y.o. female with medical history significant of DM2, Right leg  DVT no longer on anticoagulation, HTN,  HLD, presents to ED secondary to altered mental status, patient had recent ED visit on 6/29, where she was diagnosed with UTI, discharged on Keflex, she was negative for COVID-19, presents again 08/05/2018 as she still with altered mental status, she was COVID-19 positive, CT head with no acute findings, [facility].    Subjective:    Lori Crane today reports some weakness, reports has no appetite, denies any dyspnea .    Assessment  & Plan :    Active Problems:   Uncontrolled hypertension   Diabetes mellitus type 2, controlled (HCC)   History of DVT of lower extremity   COVID-19 virus infection   Acute lower UTI   Dehydration   Diarrhea  Acute metabolic encephalopathy -Is most likely in the setting of infectious process, both UTI and COVID-19, as will secondary to hypoglycemia from poor appetite. -CT head with no acute findings, improving, mentation back to baseline.  UTI -The major contributing factor for her encephalopathy, despite  receiving Keflex as an outpatient, and being compliant with it, she continued to be  encephalopathic, continue with IV Rocephin, to finish total of 3 days.  COVID 19 infection -She is not hypoxic, no  acute finding on chest x-ray, inflammatory markers WNL, so far no indication for treatment, but will continue to trend inflammatory markers closely.  Diabetes  Mellitus -Poorly controlled, hemoglobin A1c of 13 -Will hold oral hypoglycemic agents she is hypoglycemic, with very poor oral intake, I have encouraged her to increase her oral intake today, discussed with staff  to offer her some supplements with strawberry flavor. -will keep on insulin sliding scale  Hypertension -Blood pressure is soft actually despite holding all her medications, I will continue to hold, - She was encouraged to increase her fluid intake.   COVID-19 Labs  Recent Labs    08/05/18 2027 08/06/18 0738  FERRITIN 285 271  CRP 1.3* 1.5*    Lab Results  Component Value Date   SARSCOV2NAA POSITIVE (A) 08/05/2018   SARSCOV2NAA NEGATIVE 08/03/2018   Diarrhea -Secondary to COVID, still reports some diarrhea today, to increase her fluid intake  History of DVT -Reports she had been on Xarelto for 1-1/2 years, which had been discontinued recently, venous Doppler done by [employer] [facility] on 6/23 is negative  for any DVT.  Code Status : Full   Family Communication  : D/W patient  Disposition Plan  : Home when stable  Barriers For Discharge : Remains with soft blood pressure, hypoglycemic this morning, with poor appetite, outpatient Keflex, remains on IV Rocephin ,remains febrile 100.3 today  Consults  :  None  Procedures  : None  DVT Prophylaxis  :  [place] lovenox  Lab Results  Component Value Date   PLT 130 (L) 08/06/2018    Antibiotics  :    Anti-infectives (From admission, onward)   Start     Dose/Rate Route Frequency Ordered Stop   08/05/18 1845  azithromycin (ZITHROMAX) 500 mg in sodium chloride 0.9 % 250 mL IVPB     500 mg 250 mL/hr over 60 Minutes Intravenous  Once 08/05/18 1831 08/05/18 1959   08/05/18 1730  cefTRIAXone (ROCEPHIN) 1 g in sodium chloride 0.9 % 100 mL IVPB      1 g 200 mL/hr over 30 Minutes Intravenous Every 24 hours 08/05/18 1726          Objective:   Vitals:   08/06/18 0900 08/06/18 1000 08/06/18 1100 08/06/18 1140  BP:    94/61  Pulse: 80 86 83   Resp: (!) 22 17 (!) 24   Temp:    100.3 F (37.9 C)  TempSrc:    Oral  SpO2: 94% 92% 91%   Weight:      Height:        Wt Readings from Last 3 Encounters:  08/06/18 81.6 kg  08/03/18 81.6 kg  02/26/17 83.9 kg     Intake/Output Summary (Last 24 hours) at 08/06/2018 1216 Last data filed at 08/06/2018 0800 Gross per 24 hour  Intake 2950 ml  Output 600 ml  Net 2350 ml     Physical Exam  Awake Alert, Oriented X 3, No new F.N deficits, Normal affect, dry oral mucosa Symmetrical Chest wall movement, Good air movement bilaterally, CTAB RRR,No Gallops,Rubs or new Murmurs, No Parasternal Heave +ve B.Sounds, Abd Soft, No tenderness, No rebound - guarding or rigidity. No Cyanosis, Clubbing or edema, No new Rash or bruise     Data Review:    CBC Recent Labs  Lab 08/03/18 1953 08/05/18 1743 08/06/18 0738  WBC 4.4 4.5 3.2*  HGB 13.7 12.7 11.7*  HCT 42.3 39.3 36.6  PLT 131* 142* 130*  MCV 82.0 83.3 82.8  MCH 26.6 26.9 26.5  MCHC 32.4 32.3 32.0  RDW 12.8 13.0 12.9  LYMPHSABS 1.4 1.7 0.8  MONOABS 0.3 0.2 0.2  EOSABS 0.0 0.0 0.0  BASOSABS 0.0 0.0 0.0    Chemistries  Recent Labs  Lab 08/03/18 1953 08/05/18 1743 08/06/18 0738  NA 132* 136 140  K  3.2* 3.2* 3.0*  CL 94* 99 103  CO2 27 29 28   GLUCOSE 141* 52* 65*  BUN 23 28* 17  CREATININE 1.08* 1.27* 0.84  CALCIUM 8.9 8.6* 8.5*  AST 51* 49* 40  ALT 28 23 19   ALKPHOS 55 47 42  BILITOT 0.5 0.6 0.1*   ------------------------------------------------------------------------------------------------------------------ No results for input(s): CHOL, HDL, LDLCALC, TRIG, CHOLHDL, LDLDIRECT in the last 72 hours.  Lab Results  Component Value Date   HGBA1C 13.0 (H) 08/06/2018    ------------------------------------------------------------------------------------------------------------------ No results for input(s): TSH, T4TOTAL, T3FREE, THYROIDAB in the last 72 hours.  Invalid input(s): FREET3 ------------------------------------------------------------------------------------------------------------------ Recent Labs    08/05/18 2027 08/06/18 0738  FERRITIN 285 271    Coagulation profile No results for input(s): INR, PROTIME in the last 168 hours.  No results for input(s): DDIMER in the last 72 hours.  Cardiac Enzymes No results for input(s): CKMB, TROPONINI, MYOGLOBIN in the last 168 hours.  Invalid input(s): CK ------------------------------------------------------------------------------------------------------------------    Component Value Date/Time   BNP 67.0 05/04/2016 2133    Inpatient Medications  Scheduled Meds:  enoxaparin (LOVENOX) injection  40 mg Subcutaneous QHS   insulin aspart  0-9 Units Subcutaneous TID WC   simvastatin  20 mg Oral QHS   sodium chloride flush  3 mL Intravenous Q12H   Continuous Infusions:  cefTRIAXone (ROCEPHIN)  IV Stopped (08/05/18 1811)   PRN Meds:.gabapentin, sodium chloride flush  Micro Results Recent Results (from the past 240 hour(s))  SARS Coronavirus 2 (Hosp order,Performed in [facility] lab via [employer] ID)     Status: None   Collection Time: 08/03/18  8:47 PM   Specimen: Dry Nasal Swab (Abbott ID Now)  Result Value Ref Range Status   SARS Coronavirus 2 (Abbott ID Now) NEGATIVE NEGATIVE Final    Comment: (NOTE) SARS-CoV-2 target nucleic acids are NOT DETECTED. The SARS-CoV-2 RNA is generally detectable in upper and lower respiratory specimens during the acute phase of infection.  Negativeresults do not preclude SARS-CoV-2 infection, do not rule out coinfections with other pathogens, and should not be used as the  sole basis for treatment or other patient management decisions.   Negative results must be combined with clinical observations, patient history, and epidemiological information. The expected result is Negative. Fact Sheet for Patients: http://www.graves-ford.org/https://www.fda.gov/media/136524/download Fact Sheet for Healthcare Providers: EnviroConcern.sihttps://www.fda.gov/media/136523/download This test is not yet approved or cleared by the [place] FDA and  has been authorized for detection and/or diagnosis of SARS-CoV-2 by FDA under an Emergency Use Authorization (EUA).  This EUA will remain in effect (meaning this test can be used) for the duration of  the COVID19 declaration under Section 5 64(b)(1) of the Act, 21 U.S.C.  section [id number] 3(b)(1), unless the authorization is terminated or revoked sooner. Performed at [facility], [address] [place], [place] [address]   SARS Coronavirus 2 (CEPHEID - Performed in [facility] hospital lab), Hosp Order     Status: Abnormal   Collection Time: 08/05/18  5:27 PM   Specimen: Nasopharyngeal Swab  Result Value Ref Range Status   SARS Coronavirus 2 POSITIVE (A) NEGATIVE Final    Comment: RESULT CALLED TO, READ BACK BY AND VERIFIED WITH: BULLOCK,A RN @1952  ON 08/05/2018 JACKSON,K (NOTE) If result is NEGATIVE SARS-CoV-2 target nucleic acids are NOT DETECTED. The SARS-CoV-2 RNA is generally detectable in upper and lower  respiratory specimens during the acute phase of infection. The lowest  concentration of SARS-CoV-2 viral copies this assay can detect is 250  copies / mL. A negative result does not preclude SARS-CoV-2 infection  and should not be used as the sole basis for treatment or other  patient management decisions.  A negative result may occur with  improper specimen collection / handling, submission of specimen other  than nasopharyngeal swab, presence of viral mutation(s) within the  areas targeted by this assay, and inadequate number of viral copies  (<250 copies / mL). A negative result must be combined with  clinical  observations, patient history, and epidemiological information. If result is POSITIVE SARS-CoV-2 target nucleic acids are DETECTE D. The SARS-CoV-2 RNA is generally detectable in upper and lower  respiratory specimens during the acute phase of infection.  Positive  results are indicative of active infection with SARS-CoV-2.  Clinical  correlation with patient history and other diagnostic information is  necessary to determine patient infection status.  Positive results do  not rule out bacterial infection or co-infection with other viruses. If result is PRESUMPTIVE POSTIVE SARS-CoV-2 nucleic acids MAY BE PRESENT.   A presumptive positive result was obtained on the submitted specimen  and confirmed on repeat testing.  While 2019 novel coronavirus  (SARS-CoV-2) nucleic acids may be present in the submitted sample  additional confirmatory testing may be necessary for epidemiological  and / or clinical management purposes  to differentiate between  SARS-CoV-2 and other Sarbecovirus currently known to infect humans.  If clinically indicated additional testing with an alternate test  methodology (LAB745 3) is advised. The SARS-CoV-2 RNA is generally  detectable in upper and lower respiratory specimens during the acute  phase of infection. The expected result is Negative. Fact Sheet for Patients:  BoilerBrush.com.cy Fact Sheet for Healthcare Providers: https://pope.com/ This test is not yet approved or cleared by the [place] FDA and has been authorized for detection and/or diagnosis of SARS-CoV-2 by FDA under an Emergency Use Authorization (EUA).  This EUA will remain in effect (meaning this test can be used) for the duration of the COVID-19 declaration under Section 564(b)(1) of the Act, 21 U.S.C. section 360bbb-3(b)(1), unless the authorization is terminated or revoked sooner. Performed at [facility],  [address], [place], [place] [address]   Blood Culture (routine x 2)     Status: None (Preliminary result)   Collection Time: 08/05/18  5:32 PM   Specimen: BLOOD  Result Value Ref Range Status   Specimen Description   Final    BLOOD RIGHT ANTECUBITAL Performed at [facility], [address] [place], [place] [address]    Special Requests   Final    BOTTLES DRAWN AEROBIC AND ANAEROBIC Blood Culture adequate volume Performed at [facility], [address], [place], [place] [address]    Culture   Final    NO GROWTH < 12 HOURS Performed at [facility], [address], [place], [place] [address]    Report Status PENDING  Incomplete  Blood Culture (routine x 2)     Status: None (Preliminary result)   Collection Time: 08/05/18  5:43 PM   Specimen: BLOOD  Result Value Ref Range Status   Specimen Description   Final    BLOOD RIGHT ANTECUBITAL Performed at [facility], [address], [place], [place] [address]    Special Requests   Final    Blood Culture adequate volume Performed at [facility], [address], [place], [place] [address]    Culture   Final    NO  GROWTH < 12 HOURS Performed at [facility] [address] [place], [place] [address]    Report Status PENDING  Incomplete    Radiology Reports Ct Head Wo Contrast  Result Date: 08/03/2018 CLINICAL DATA:  Confusion, shortness of breath, fatigue EXAM: CT HEAD WITHOUT CONTRAST TECHNIQUE: Contiguous axial images were obtained from the base of the skull through the vertex without intravenous contrast. COMPARISON:  06/10/2016 FINDINGS: Brain: No evidence of acute infarction, hemorrhage, hydrocephalus, extra-axial collection or mass lesion/mass effect. Vascular: No hyperdense vessel or unexpected calcification. Skull: Normal. Negative for fracture or focal lesion. Sinuses/Orbits: No acute finding. Other: None. IMPRESSION: No acute  intracranial pathology. Electronically Signed   By: Eddie Candle M.D.   On: 08/03/2018 21:19   Ct Angio Chest Pe W And/or Wo Contrast  Result Date: 08/05/2018 CLINICAL DATA:  63 year old female with COVID-19, increased confusion. EXAM: CT ANGIOGRAPHY CHEST WITH CONTRAST TECHNIQUE: Multidetector CT imaging of the chest was performed using the standard protocol during bolus administration of intravenous contrast. Multiplanar CT image reconstructions and MIPs were obtained to evaluate the vascular anatomy. CONTRAST:  131mL OMNIPAQUE IOHEXOL 350 MG/ML SOLN COMPARISON:  Portable chest earlier today. CT Abdomen and Pelvis 07/09/2013. FINDINGS: Cardiovascular: Good contrast bolus timing in the pulmonary arterial tree. Mild respiratory motion, especially in the lower lobes. No focal filling defect identified in the pulmonary arteries to suggest acute pulmonary embolism. Calcified aortic atherosclerosis. Mild cardiomegaly. No pericardial effusion. Mediastinum/Nodes: Negative, no mediastinal lymphadenopathy. Lungs/Pleura: Major airways are patent. There is mild scattered bilateral pulmonary ground-glass opacity, 1 of the more pronounced areas is at the posterior inferior right upper lobe on series 10, image 62. Lung volumes are also lower, so some of the lower lobe ground-glass opacity is probably superimposed atelectasis. No pleural effusion or consolidation. Upper Abdomen: Negative visible liver, spleen, pancreas, adrenal glands and bowel. Possible small gastric hiatal hernia. Stable partially visible right renal cortical scarring. Musculoskeletal: No acute osseous abnormality identified. Review of the MIP images confirms the above findings. IMPRESSION: 1.  Negative for acute pulmonary embolus. 2. Atelectasis with superimposed scattered bilateral ground-glass opacity compatible with COVID-19 pneumonia. No pleural effusion. 3.  Aortic Atherosclerosis (ICD10-I70.0). Electronically Signed   By: Genevie Ann M.D.   On: 08/05/2018  21:53   Dg Chest Port 1 View  Result Date: 08/05/2018 CLINICAL DATA:  Fever EXAM: PORTABLE CHEST 1 VIEW COMPARISON:  August 03, 2018 FINDINGS: There is a somewhat indistinct right heart border. No pneumothorax. No large pleural effusion. There is some blunting of the left costophrenic angle. There is no acute osseous abnormality. The heart size is stable from prior study. Lung volumes remain somewhat low. IMPRESSION: 1. Somewhat indistinct right heart border in comparison to prior study. Findings could represent a developing infiltrate or atelectasis. 2. Low lung volumes. Left basilar airspace opacities are favored to represent atelectasis, however an infiltrate is not excluded. Electronically Signed   By: Constance Holster M.D.   On: 08/05/2018 18:12   Dg Chest Port 1 View  Result Date: 08/03/2018 CLINICAL DATA:  63 year old female with confusion, shortness of breath and fatigue for 2 days. Fever. Negative for COVID-19. Today. EXAM: PORTABLE CHEST 1 VIEW COMPARISON:  Chest radiographs 05/04/2016 and earlier. FINDINGS: Portable AP semi upright view at 2029 hours. Lower lung volumes. Normal cardiac size and mediastinal contours. Visualized tracheal air column is within normal limits. Allowing for portable technique the lungs are clear. No pneumothorax. Paucity of bowel gas in the upper abdomen. No acute osseous  abnormality identified. IMPRESSION: Lower lung volumes.  No acute cardiopulmonary abnormality. Electronically Signed   By: Odessa FlemingH  Hall M.D.   On: 08/03/2018 21:23    Time Spent in minutes  25 minutes   Huey Bienenstockawood Hellon Vaccarella M.D on 08/06/2018 at 12:16 PM  Between 7am to 7pm - Pager - [phone]  After 7pm go to [email or web address] - password [email or web address]  [facility] -  Office  [phone]

## 2018-08-07 LAB — GLUCOSE, CAPILLARY
Glucose-Capillary: 121 mg/dL — ABNORMAL HIGH (ref 70–99)
Glucose-Capillary: 178 mg/dL — ABNORMAL HIGH (ref 70–99)
Glucose-Capillary: 270 mg/dL — ABNORMAL HIGH (ref 70–99)
Glucose-Capillary: 302 mg/dL — ABNORMAL HIGH (ref 70–99)

## 2018-08-07 LAB — INTERLEUKIN-6, PLASMA: Interleukin-6, Plasma: 51.6 pg/mL — ABNORMAL HIGH (ref 0.0–12.2)

## 2018-08-07 LAB — COMPREHENSIVE METABOLIC PANEL
ALT: 16 U/L (ref 0–44)
AST: 40 U/L (ref 15–41)
Albumin: 2.7 g/dL — ABNORMAL LOW (ref 3.5–5.0)
Alkaline Phosphatase: 43 U/L (ref 38–126)
Anion gap: 11 (ref 5–15)
BUN: 11 mg/dL (ref 8–23)
CO2: 27 mmol/L (ref 22–32)
Calcium: 8.3 mg/dL — ABNORMAL LOW (ref 8.9–10.3)
Chloride: 100 mmol/L (ref 98–111)
Creatinine, Ser: 0.78 mg/dL (ref 0.44–1.00)
GFR calc Af Amer: >60 mL/min (ref >60)
GFR calc non Af Amer: >60 mL/min (ref >60)
Glucose, Bld: 62 mg/dL — ABNORMAL LOW (ref 70–99)
Potassium: 3.4 mmol/L — ABNORMAL LOW (ref 3.5–5.1)
Sodium: 138 mmol/L (ref 135–145)
Total Bilirubin: 0.5 mg/dL (ref 0.3–1.2)
Total Protein: 6.7 g/dL (ref 6.5–8.1)

## 2018-08-07 LAB — CBC
HCT: 37.8 % (ref 36.0–46.0)
Hemoglobin: 12.2 g/dL (ref 12.0–15.0)
MCH: 27 pg (ref 26.0–34.0)
MCHC: 32.3 g/dL (ref 30.0–36.0)
MCV: 83.6 fL (ref 80.0–100.0)
Platelets: 145 10*3/uL — ABNORMAL LOW (ref 150–400)
RBC: 4.52 MIL/uL (ref 3.87–5.11)
RDW: 13 % (ref 11.5–15.5)
WBC: 3.5 10*3/uL — ABNORMAL LOW (ref 4.0–10.5)
nRBC: 0 % (ref 0.0–0.2)

## 2018-08-07 LAB — D-DIMER, QUANTITATIVE: D-Dimer, Quant: 0.39 ug/mL-FEU (ref 0.00–0.50)

## 2018-08-07 LAB — FERRITIN: Ferritin: 275 ng/mL (ref 11–307)

## 2018-08-07 LAB — C-REACTIVE PROTEIN: CRP: 2.1 mg/dL — ABNORMAL HIGH (ref <1.0)

## 2018-08-07 MED ORDER — FAMOTIDINE 20 MG PO TABS
20.0000 mg | ORAL_TABLET | Freq: Every day | ORAL | Status: DC
  Administered 2018-08-07 – 2018-08-08 (×2): 20 mg via ORAL
  Filled 2018-08-07 (×2): qty 1

## 2018-08-07 MED ORDER — LOPERAMIDE HCL 2 MG PO CAPS
2.0000 mg | ORAL_CAPSULE | ORAL | Status: DC | PRN
  Administered 2018-08-07: 2 mg via ORAL
  Filled 2018-08-07: qty 1

## 2018-08-07 MED ORDER — ENSURE ENLIVE PO LIQD
237.0000 mL | Freq: Two times a day (BID) | ORAL | Status: DC
  Filled 2018-08-07 (×5): qty 237

## 2018-08-07 MED ORDER — ENSURE ENLIVE PO LIQD: 237.0000 mL | Freq: Two times a day (BID) | ORAL | Status: DC

## 2018-08-07 MED ORDER — METHYLPREDNISOLONE SODIUM SUCC 40 MG IJ SOLR
40.0000 mg | Freq: Three times a day (TID) | INTRAMUSCULAR | Status: DC
  Administered 2018-08-07 – 2018-08-08 (×4): 40 mg via INTRAVENOUS
  Filled 2018-08-07 (×3): qty 1

## 2018-08-07 MED ORDER — INSULIN ASPART 100 UNIT/ML ~~LOC~~ SOLN
0.0000 [IU] | Freq: Every day | SUBCUTANEOUS | Status: DC
  Administered 2018-08-07: 3 [IU] via SUBCUTANEOUS

## 2018-08-07 MED ORDER — POTASSIUM CHLORIDE CRYS ER 20 MEQ PO TBCR
30.0000 meq | EXTENDED_RELEASE_TABLET | ORAL | Status: AC
  Administered 2018-08-07 (×2): 30 meq via ORAL
  Filled 2018-08-07 (×2): qty 2

## 2018-08-07 NOTE — Progress Notes (Signed)
Per H/P pt first COID test was done on 08/03/18 at [facility] which pt confirms. Second test done In ED at [facility]

## 2018-08-07 NOTE — Progress Notes (Addendum)
PROGRESS NOTE                                                                                                                                                                                                             Patient Demographics:    Lori Crane, is a 63 y.o. female, DOB - [birth year], WUJ:[id number]  Admit date - 08/05/2018   Admitting Physician Therisa Doyne, MD  Outpatient Primary MD for the patient is [place], [place]  LOS - 2   Chief Complaint  Patient presents with  . Fever  . Altered Mental Status       Brief Narrative     63 y.o. female with medical history significant of DM2, Right leg  DVT no longer on anticoagulation, HTN,  HLD, presents to ED secondary to altered mental status, patient had recent ED visit on 6/29, where she was diagnosed with UTI, discharged on Keflex, she was negative for COVID-19, presents again 08/05/2018 as she still with altered mental status, she was COVID-19 positive, CT head with no acute findings, she was transferred to [facility] for further management.   Subjective:    Lori Crane today still reports some diarrhea, still with poor appetite and fluid intake, she was encouraged to increase her supplement intake   Assessment  & Plan :    Active Problems:   Uncontrolled hypertension   Diabetes mellitus type 2, controlled (HCC)   History of DVT of lower extremity   COVID-19 virus infection   Acute lower UTI   Dehydration   Diarrhea  Acute metabolic encephalopathy -Is most likely in the setting of infectious process, both UTI and COVID-19, as will secondary to hypoglycemia from poor appetite. -CT head with no acute findings, improving, mentation back to baseline.  UTI -The major contributing factor for her encephalopathy, despite  receiving Keflex as an outpatient, and being compliant with it, she continued to be  encephalopathic, continue with IV Rocephin  COVID 19 infection  -CT chest significant for bilateral opacity due to COVID-19 for pneumonia, is not hypoxic, but her inflammatory markers are trending up from normal limits, she was started on IV steroids. -Continue to trend inflammatory markers.  Diabetes  Mellitus -Poorly controlled, hemoglobin A1c of 13 -Will hold oral hypoglycemic , her glucose this morning was 62, continue with insulin sliding scale coverage only,  she was encouraged to increase her oral intake, continue with insulin sliding scale coverage .  Hypertension -blood Pressure remains soft despite holding her medications  COVID-19 Labs  Recent Labs    08/05/18 2027 08/06/18 0738 08/07/18 0310  DDIMER  --   --  0.39  FERRITIN 285 271 275  CRP 1.3* 1.5* 2.1*    Lab Results  Component Value Date   SARSCOV2NAA POSITIVE (A) 08/05/2018   [place] NEGATIVE 08/03/2018   Diarrhea -Secondary to COVID, present, will start on Imodium, encouraged to increase her fluid intake.  Hypokalemia -Repleted  History of DVT -Reports she had been on Xarelto for 1-1/2 years, which had been discontinued recently, venous Doppler done by [facility] on 6/23 is negative  for any DVT.  Code Status : Full   Family Communication  : D/W patient  Disposition Plan  : Home when stable  Consults  :  None  Procedures  : None  DVT Prophylaxis  :  [place] lovenox  Lab Results  Component Value Date   PLT 145 (L) 08/07/2018    Antibiotics  :    Anti-infectives (From admission, onward)   Start     Dose/Rate Route Frequency Ordered Stop   08/05/18 1845  azithromycin (ZITHROMAX) 500 mg in sodium chloride 0.9 % 250 mL IVPB     500 mg 250 mL/hr over 60 Minutes Intravenous  Once 08/05/18 1831 08/05/18 1959   08/05/18 1730  cefTRIAXone (ROCEPHIN) 1 g in sodium chloride 0.9 % 100 mL IVPB     1 g 200 mL/hr over 30 Minutes Intravenous Every 24 hours 08/05/18 1726          Objective:   Vitals:   08/07/18 1000 08/07/18 1100 08/07/18  1200 08/07/18 1208  BP:    120/63  Pulse: 88 80 82 83  Resp: 19 20 (!) 23 18  Temp:    99.5 F (37.5 C)  TempSrc:    Oral  SpO2: 93% 94% 93% 93%  Weight:      Height:        Wt Readings from Last 3 Encounters:  08/06/18 81.6 kg  08/03/18 81.6 kg  02/26/17 83.9 kg     Intake/Output Summary (Last 24 hours) at 08/07/2018 1235 Last data filed at 08/06/2018 1800 Gross per 24 hour  Intake 100 ml  Output -  Net 100 ml     Physical Exam  Awake Alert, Oriented X 3, No new F.N deficits, Normal affect Symmetrical Chest wall movement, Good air movement bilaterally, CTAB RRR,No Gallops,Rubs or new Murmurs, No Parasternal Heave +ve B.Sounds, Abd Soft, No tenderness, No rebound - guarding or rigidity. No Cyanosis, Clubbing or edema, No new Rash or bruise       Data Review:    CBC Recent Labs  Lab 08/03/18 1953 08/05/18 1743 08/06/18 0738 08/07/18 0310  WBC 4.4 4.5 3.2* 3.5*  HGB 13.7 12.7 11.7* 12.2  HCT 42.3 39.3 36.6 37.8  PLT 131* 142* 130* 145*  MCV 82.0 83.3 82.8 83.6  MCH 26.6 26.9 26.5 27.0  MCHC 32.4 32.3 32.0 32.3  RDW 12.8 13.0 12.9 13.0  LYMPHSABS 1.4 1.7 0.8  --   MONOABS 0.3 0.2 0.2  --   EOSABS 0.0 0.0 0.0  --   BASOSABS 0.0 0.0 0.0  --     Chemistries  Recent Labs  Lab 08/03/18 1953 08/05/18 1743 08/06/18 0738 08/07/18 0310  NA 132* 136 140 138  K 3.2* 3.2* 3.0*  3.4*  CL 94* 99 103 100  CO2 27 29 28 27   GLUCOSE 141* 52* 65* 62*  BUN 23 28* 17 11  CREATININE 1.08* 1.27* 0.84 0.78  CALCIUM 8.9 8.6* 8.5* 8.3*  AST 51* 49* 40 40  ALT 28 23 19 16   ALKPHOS 55 47 42 43  BILITOT 0.5 0.6 0.1* 0.5   ------------------------------------------------------------------------------------------------------------------ No results for input(s): CHOL, HDL, LDLCALC, TRIG, CHOLHDL, LDLDIRECT in the last 72 hours.  Lab Results  Component Value Date   HGBA1C 13.0 (H) 08/06/2018    ------------------------------------------------------------------------------------------------------------------ No results for input(s): TSH, T4TOTAL, T3FREE, THYROIDAB in the last 72 hours.  Invalid input(s): FREET3 ------------------------------------------------------------------------------------------------------------------ Recent Labs    08/06/18 0738 08/07/18 0310  FERRITIN 271 275    Coagulation profile No results for input(s): INR, PROTIME in the last 168 hours.  Recent Labs    08/07/18 0310  DDIMER 0.39    Cardiac Enzymes No results for input(s): CKMB, TROPONINI, MYOGLOBIN in the last 168 hours.  Invalid input(s): CK ------------------------------------------------------------------------------------------------------------------    Component Value Date/Time   BNP 67.0 05/04/2016 2133    Inpatient Medications  Scheduled Meds: . enoxaparin (LOVENOX) injection  40 mg Subcutaneous QHS  . famotidine  20 mg Oral Daily  . feeding supplement (ENSURE ENLIVE)  237 mL Oral BID BM  . insulin aspart  0-9 Units Subcutaneous TID WC  . methylPREDNISolone (SOLU-MEDROL) injection  40 mg Intravenous Q8H  . metoprolol tartrate  25 mg Oral BID  . potassium chloride  30 mEq Oral Q4H  . simvastatin  20 mg Oral QHS  . sodium chloride flush  3 mL Intravenous Q12H   Continuous Infusions: . cefTRIAXone (ROCEPHIN)  IV 1 g (08/06/18 1654)   PRN Meds:.acetaminophen, gabapentin, loperamide, sodium chloride flush  Micro Results Recent Results (from the past 240 hour(s))  SARS Coronavirus 2 (Hosp order,Performed in [facility] lab via Abbott ID)     Status: None   Collection Time: 08/03/18  8:47 PM   Specimen: Dry Nasal Swab (Abbott ID Now)  Result Value Ref Range Status   SARS Coronavirus 2 (Abbott ID Now) NEGATIVE NEGATIVE Final    Comment: (NOTE) SARS-CoV-2 target nucleic acids are NOT DETECTED. The SARS-CoV-2 RNA is generally detectable in upper and lower respiratory  specimens during the acute phase of infection.  Negativeresults do not preclude SARS-CoV-2 infection, do not rule out coinfections with other pathogens, and should not be used as the  sole basis for treatment or other patient management decisions.  Negative results must be combined with clinical observations, patient history, and epidemiological information. The expected result is Negative. Fact Sheet for Patients: http://www.graves-ford.org/https://www.fda.gov/media/136524/download Fact Sheet for Healthcare Providers: EnviroConcern.sihttps://www.fda.gov/media/136523/download This test is not yet approved or cleared by the [place] FDA and  has been authorized for detection and/or diagnosis of SARS-CoV-2 by FDA under an Emergency Use Authorization (EUA).  This EUA will remain in effect (meaning this test can be used) for the duration of  the COVID19 declaration under Section 5 64(b)(1) of the Act, 21 U.S.C.  section 3523420629360bbb 3(b)(1), unless the authorization is terminated or revoked sooner. Performed at [facility] [place], [address] [place], [place] [address]   Urine culture     Status: None   Collection Time: 08/05/18  5:27 PM   Specimen: Urine, Clean Catch  Result Value Ref Range Status   Specimen Description   Final    URINE, CLEAN CATCH Performed at [facility], [address]  [place], [place] [address]    Special Requests   Final    NONE Performed at [facility], [address] [place], [place] [address]    Culture   Final    NO GROWTH Performed at [facility], [address] [place], [place] [address]    Report Status 08/06/2018 FINAL  Final  SARS Coronavirus 2 (CEPHEID - Performed in [facility] hospital lab), Hosp Order     Status: Abnormal   Collection Time: 08/05/18  5:27 PM   Specimen: Nasopharyngeal Swab  Result Value Ref Range Status   SARS Coronavirus 2 POSITIVE (A) NEGATIVE Final    Comment: RESULT CALLED TO, READ BACK BY AND VERIFIED  WITH: BULLOCK,A RN @1952  ON 08/05/2018 JACKSON,K (NOTE) If result is NEGATIVE SARS-CoV-2 target nucleic acids are NOT DETECTED. The SARS-CoV-2 RNA is generally detectable in upper and lower  respiratory specimens during the acute phase of infection. The lowest  concentration of SARS-CoV-2 viral copies this assay can detect is 250  copies / mL. A negative result does not preclude SARS-CoV-2 infection  and should not be used as the sole basis for treatment or other  patient management decisions.  A negative result may occur with  improper specimen collection / handling, submission of specimen other  than nasopharyngeal swab, presence of viral mutation(s) within the  areas targeted by this assay, and inadequate number of viral copies  (<250 copies / mL). A negative result must be combined with clinical  observations, patient history, and epidemiological information. If result is POSITIVE SARS-CoV-2 target nucleic acids are DETECTE D. The SARS-CoV-2 RNA is generally detectable in upper and lower  respiratory specimens during the acute phase of infection.  Positive  results are indicative of active infection with SARS-CoV-2.  Clinical  correlation with patient history and other diagnostic information is  necessary to determine patient infection status.  Positive results do  not rule out bacterial infection or co-infection with other viruses. If result is PRESUMPTIVE POSTIVE SARS-CoV-2 nucleic acids MAY BE PRESENT.   A presumptive positive result was obtained on the submitted specimen  and confirmed on repeat testing.  While 2019 novel coronavirus  (SARS-CoV-2) nucleic acids may be present in the submitted sample  additional confirmatory testing may be necessary for epidemiological  and / or clinical management purposes  to differentiate between  SARS-CoV-2 and other Sarbecovirus currently known to infect humans.  If clinically indicated additional testing with an alternate test   methodology (LAB745 3) is advised. The SARS-CoV-2 RNA is generally  detectable in upper and lower respiratory specimens during the acute  phase of infection. The expected result is Negative. Fact Sheet for Patients:  BoilerBrush.com.cyhttps://www.fda.gov/media/136312/download Fact Sheet for Healthcare Providers: https://pope.com/https://www.fda.gov/media/136313/download This test is not yet approved or cleared by the [place] FDA and has been authorized for detection and/or diagnosis of SARS-CoV-2 by FDA under an Emergency Use Authorization (EUA).  This EUA will remain in effect (meaning this test can be used) for the duration of the COVID-19 declaration under Section 564(b)(1) of the Act, 21 U.S.C. section 360bbb-3(b)(1), unless the authorization is terminated or revoked sooner. Performed at [facility], [address] [place], [place] [address]   Blood Culture (routine x 2)     Status: None (Preliminary result)   Collection Time: 08/05/18  5:32 PM   Specimen: BLOOD  Result Value Ref Range Status   Specimen Description   Final    BLOOD RIGHT ANTECUBITAL Performed at [facility], [address] [place], [place] [address]    Special Requests   Final    BOTTLES DRAWN AEROBIC AND ANAEROBIC Blood Culture adequate volume Performed at [facility], [address] [place], [place] [address]    Culture   Final    NO GROWTH 2 DAYS Performed at [facility], [address] [place], [place] [address]    Report Status PENDING  Incomplete  Blood Culture (routine x 2)     Status: None (Preliminary result)   Collection Time: 08/05/18  5:43 PM   Specimen: BLOOD  Result Value Ref Range Status   Specimen Description   Final    BLOOD RIGHT ANTECUBITAL Performed at [facility], [address] [place], [place] [address]    Special Requests   Final    Blood Culture adequate volume Performed at [facility], [address] [place], [place] [address]    Culture   Final    NO GROWTH 2 DAYS Performed at [facility], [address] [place], [place] [address]    Report Status PENDING  Incomplete    Radiology Reports Ct Head Wo Contrast  Result Date: 08/03/2018 CLINICAL DATA:  Confusion, shortness of breath, fatigue EXAM: CT HEAD WITHOUT CONTRAST TECHNIQUE: Contiguous axial images were obtained from the base of the skull through the vertex without intravenous contrast. COMPARISON:  06/10/2016 FINDINGS: Brain: No evidence of acute infarction, hemorrhage, hydrocephalus, extra-axial collection or mass lesion/mass effect. Vascular: No hyperdense vessel or unexpected calcification. Skull: Normal. Negative for fracture or focal lesion. Sinuses/Orbits: No acute finding. Other: None. IMPRESSION: No acute intracranial pathology. Electronically Signed   By: Lauralyn PrimesAlex  Bibbey M.D.   On: 08/03/2018 21:19   Ct Angio Chest Pe W And/or Wo Contrast  Result Date: 08/05/2018 CLINICAL DATA:  63 year old female with COVID-19, increased confusion. EXAM: CT ANGIOGRAPHY CHEST WITH CONTRAST TECHNIQUE: Multidetector CT imaging of the chest was performed using the standard protocol during bolus administration of intravenous contrast. Multiplanar CT image reconstructions and MIPs were obtained to evaluate the vascular anatomy. CONTRAST:  100mL OMNIPAQUE IOHEXOL 350 MG/ML SOLN COMPARISON:  Portable chest earlier today. CT Abdomen and Pelvis 07/09/2013. FINDINGS: Cardiovascular: Good contrast bolus timing in the pulmonary arterial tree. Mild respiratory motion, especially in the lower lobes. No focal filling defect identified in the pulmonary arteries to suggest acute pulmonary embolism. Calcified aortic atherosclerosis. Mild cardiomegaly. No pericardial effusion. Mediastinum/Nodes: Negative, no mediastinal lymphadenopathy. Lungs/Pleura: Major airways are patent. There is mild scattered bilateral pulmonary ground-glass opacity, 1 of the more  pronounced areas is at the posterior inferior right upper lobe on series 10, image 62. Lung volumes are also lower, so some of the lower lobe ground-glass opacity is probably superimposed atelectasis. No pleural effusion or consolidation. Upper Abdomen: Negative visible liver, spleen, pancreas, adrenal glands and bowel. Possible small gastric hiatal hernia. Stable partially visible right renal cortical scarring. Musculoskeletal: No acute osseous abnormality identified. Review of the MIP images confirms the above findings. IMPRESSION: 1.  Negative for acute pulmonary embolus. 2. Atelectasis with superimposed scattered bilateral ground-glass opacity compatible with COVID-19 pneumonia. No pleural effusion. 3.  Aortic Atherosclerosis (ICD10-I70.0). Electronically Signed   By: Odessa FlemingH  Hall M.D.   On: 08/05/2018 21:53   Dg Chest Port 1 View  Result Date: 08/05/2018 CLINICAL DATA:  Fever EXAM: PORTABLE CHEST 1 VIEW COMPARISON:  August 03, 2018 FINDINGS: There is a somewhat indistinct right heart border. No pneumothorax. No large  pleural effusion. There is some blunting of the left costophrenic angle. There is no acute osseous abnormality. The heart size is stable from prior study. Lung volumes remain somewhat low. IMPRESSION: 1. Somewhat indistinct right heart border in comparison to prior study. Findings could represent a developing infiltrate or atelectasis. 2. Low lung volumes. Left basilar airspace opacities are favored to represent atelectasis, however an infiltrate is not excluded. Electronically Signed   By: Katherine Mantle M.D.   On: 08/05/2018 18:12   Dg Chest Port 1 View  Result Date: 08/03/2018 CLINICAL DATA:  63 year old female with confusion, shortness of breath and fatigue for 2 days. Fever. Negative for COVID-19. Today. EXAM: PORTABLE CHEST 1 VIEW COMPARISON:  Chest radiographs 05/04/2016 and earlier. FINDINGS: Portable AP semi upright view at 2029 hours. Lower lung volumes. Normal cardiac size and  mediastinal contours. Visualized tracheal air column is within normal limits. Allowing for portable technique the lungs are clear. No pneumothorax. Paucity of bowel gas in the upper abdomen. No acute osseous abnormality identified. IMPRESSION: Lower lung volumes.  No acute cardiopulmonary abnormality. Electronically Signed   By: Odessa Fleming M.D.   On: 08/03/2018 21:23    Time Spent in minutes  25 minutes   Huey Bienenstock M.D on 08/07/2018 at 12:35 PM  Between 7am to 7pm - Pager - [phone]  After 7pm go to [email or web address] - password [facility]  [facility] -  Office  [phone]

## 2018-08-08 LAB — CBC
HCT: 41.4 % (ref 36.0–46.0)
Hemoglobin: 13 g/dL (ref 12.0–15.0)
MCH: 26.2 pg (ref 26.0–34.0)
MCHC: 31.4 g/dL (ref 30.0–36.0)
MCV: 83.3 fL (ref 80.0–100.0)
Platelets: 187 10*3/uL (ref 150–400)
RBC: 4.97 MIL/uL (ref 3.87–5.11)
RDW: 12.5 % (ref 11.5–15.5)
WBC: 2.7 10*3/uL — ABNORMAL LOW (ref 4.0–10.5)
nRBC: 0 % (ref 0.0–0.2)

## 2018-08-08 LAB — COMPREHENSIVE METABOLIC PANEL
ALT: 16 U/L (ref 0–44)
AST: 32 U/L (ref 15–41)
Albumin: 2.8 g/dL — ABNORMAL LOW (ref 3.5–5.0)
Alkaline Phosphatase: 47 U/L (ref 38–126)
Anion gap: 11 (ref 5–15)
BUN: 17 mg/dL (ref 8–23)
CO2: 25 mmol/L (ref 22–32)
Calcium: 8.9 mg/dL (ref 8.9–10.3)
Chloride: 102 mmol/L (ref 98–111)
Creatinine, Ser: 0.78 mg/dL (ref 0.44–1.00)
GFR calc Af Amer: >60 mL/min (ref >60)
GFR calc non Af Amer: >60 mL/min (ref >60)
Glucose, Bld: 245 mg/dL — ABNORMAL HIGH (ref 70–99)
Potassium: 5 mmol/L (ref 3.5–5.1)
Sodium: 138 mmol/L (ref 135–145)
Total Bilirubin: 0.4 mg/dL (ref 0.3–1.2)
Total Protein: 7.5 g/dL (ref 6.5–8.1)

## 2018-08-08 LAB — FERRITIN: Ferritin: 340 ng/mL — ABNORMAL HIGH (ref 11–307)

## 2018-08-08 LAB — D-DIMER, QUANTITATIVE: D-Dimer, Quant: 0.59 ug/mL-FEU — ABNORMAL HIGH (ref 0.00–0.50)

## 2018-08-08 LAB — GLUCOSE, CAPILLARY
Glucose-Capillary: 405 mg/dL — ABNORMAL HIGH (ref 70–99)
Glucose-Capillary: 348 mg/dL — ABNORMAL HIGH (ref 70–99)
Glucose-Capillary: 304 mg/dL — ABNORMAL HIGH (ref 70–99)

## 2018-08-08 LAB — C-REACTIVE PROTEIN: CRP: 3.1 mg/dL — ABNORMAL HIGH (ref <1.0)

## 2018-08-08 MED ORDER — GLIMEPIRIDE 4 MG PO TABS
4.0000 mg | ORAL_TABLET | Freq: Two times a day (BID) | ORAL | Status: DC
  Administered 2018-08-08: 12:00:00 4 mg via ORAL
  Filled 2018-08-08 (×4): qty 1

## 2018-08-08 MED ORDER — ACETAMINOPHEN 325 MG PO TABS: 650.0000 mg | ORAL_TABLET | Freq: Four times a day (QID) | ORAL | Status: AC | PRN

## 2018-08-08 MED ORDER — INSULIN ASPART 100 UNIT/ML ~~LOC~~ SOLN
20.0000 [IU] | Freq: Once | SUBCUTANEOUS | Status: AC
  Administered 2018-08-08: 20 [IU] via SUBCUTANEOUS

## 2018-08-08 MED ORDER — DEXAMETHASONE 6 MG PO TABS: 6.0000 mg | ORAL_TABLET | Freq: Two times a day (BID) | ORAL | 0 refills | Status: DC

## 2018-08-08 MED ORDER — INSULIN DEGLUDEC 100 UNIT/ML ~~LOC~~ SOPN: 50.0000 [IU] | PEN_INJECTOR | Freq: Every day | SUBCUTANEOUS | Status: DC

## 2018-08-08 MED ORDER — TELMISARTAN-HCTZ 80-25 MG PO TABS: 1.0000 | ORAL_TABLET | Freq: Every day | ORAL | Status: DC

## 2018-08-08 MED ORDER — INSULIN GLARGINE 100 UNIT/ML ~~LOC~~ SOLN
50.0000 [IU] | Freq: Every day | SUBCUTANEOUS | Status: DC
  Administered 2018-08-08: 50 [IU] via SUBCUTANEOUS
  Filled 2018-08-08: qty 0.5

## 2018-08-08 MED ORDER — PANTOPRAZOLE SODIUM 40 MG PO TBEC: 40.0000 mg | DELAYED_RELEASE_TABLET | Freq: Every day | ORAL | 0 refills | Status: AC

## 2018-08-08 NOTE — Discharge Instructions (Signed)
Person Under Monitoring Name: Lori Crane  Location: [address] [place] [address]   Infection Prevention Recommendations for Individuals Confirmed to have, or Being Evaluated for, 2019 Novel Coronavirus (COVID-19) Infection Who Receive Care at Home  Individuals who are confirmed to have, or are being evaluated for, COVID-19 should follow the prevention steps below until a healthcare provider or local or state health department says they can return to normal activities.  Stay home except to get medical care You should restrict activities outside your home, except for getting medical care. Do not go to work, school, or public areas, and do not use public transportation or taxis.  Call ahead before visiting your doctor Before your medical appointment, call the healthcare provider and tell them that you have, or are being evaluated for, COVID-19 infection. This will help the healthcare providers office take steps to keep other people from getting infected. Ask your healthcare provider to call the local or state health department.  Monitor your symptoms Seek prompt medical attention if your illness is worsening (e.g., difficulty breathing). Before going to your medical appointment, call the healthcare provider and tell them that you have, or are being evaluated for, COVID-19 infection. Ask your healthcare provider to call the local or state health department.  Wear a facemask You should wear a facemask that covers your nose and mouth when you are in the same room with other people and when you visit a healthcare provider. People who live with or visit you should also wear a facemask while they are in the same room with you.  Separate yourself from other people in your home As much as possible, you should stay in a different room from other people in your home. Also, you should use a separate bathroom, if available.  Avoid sharing household items You should not share  dishes, drinking glasses, cups, eating utensils, towels, bedding, or other items with other people in your home. After using these items, you should wash them thoroughly with soap and water.  Cover your coughs and sneezes Cover your mouth and nose with a tissue when you cough or sneeze, or you can cough or sneeze into your sleeve. Throw used tissues in a lined trash can, and immediately wash your hands with soap and water for at least 20 seconds or use an alcohol-based hand rub.  Wash your [facility] your hands often and thoroughly with soap and water for at least 20 seconds. You can use an alcohol-based hand sanitizer if soap and water are not available and if your hands are not visibly dirty. Avoid touching your eyes, nose, and mouth with unwashed hands.   Prevention Steps for Caregivers and Household Members of Individuals Confirmed to have, or Being Evaluated for, COVID-19 Infection Being Cared for in the Home  If you live with, or provide care at home for, a person confirmed to have, or being evaluated for, COVID-19 infection please follow these guidelines to prevent infection:  Follow healthcare providers instructions Make sure that you understand and can help the patient follow any healthcare provider instructions for all care.  Provide for the patients basic needs You should help the patient with basic needs in the home and provide support for getting groceries, prescriptions, and other personal needs.  Monitor the patients symptoms If they are getting sicker, call his or her medical provider and tell them that the patient has, or is being evaluated for, COVID-19 infection. This will help the healthcare providers  office take steps to keep other people from getting infected. Ask the healthcare provider to call the local or state health department.  Limit the number of people who have contact with the patient  If possible, have only one caregiver for the patient.  Other  household members should stay in another home or place of residence. If this is not possible, they should stay  in another room, or be separated from the patient as much as possible. Use a separate bathroom, if available.  Restrict visitors who do not have an essential need to be in the home.  Keep older adults, very young children, and other sick people away from the patient Keep older adults, very young children, and those who have compromised immune systems or chronic health conditions away from the patient. This includes people with chronic heart, lung, or kidney conditions, diabetes, and cancer.  Ensure good ventilation Make sure that shared spaces in the home have good air flow, such as from an air conditioner or an opened window, weather permitting.  Wash your hands often  Wash your hands often and thoroughly with soap and water for at least 20 seconds. You can use an alcohol based hand sanitizer if soap and water are not available and if your hands are not visibly dirty.  Avoid touching your eyes, nose, and mouth with unwashed hands.  Use disposable paper towels to dry your hands. If not available, use dedicated cloth towels and replace them when they become wet.  Wear a facemask and gloves  Wear a disposable facemask at all times in the room and gloves when you touch or have contact with the patients blood, body fluids, and/or secretions or excretions, such as sweat, saliva, sputum, nasal mucus, vomit, urine, or feces.  Ensure the mask fits over your nose and mouth tightly, and do not touch it during use.  Throw out disposable facemasks and gloves after using them. Do not reuse.  Wash your hands immediately after removing your facemask and gloves.  If your personal clothing becomes contaminated, carefully remove clothing and launder. Wash your hands after handling contaminated clothing.  Place all used disposable facemasks, gloves, and other waste in a lined container before  disposing them with other household waste.  Remove gloves and wash your hands immediately after handling these items.  Do not share dishes, glasses, or other household items with the patient  Avoid sharing household items. You should not share dishes, drinking glasses, cups, eating utensils, towels, bedding, or other items with a patient who is confirmed to have, or being evaluated for, COVID-19 infection.  After the person uses these items, you should wash them thoroughly with soap and water.  Wash laundry thoroughly  Immediately remove and wash clothes or bedding that have blood, body fluids, and/or secretions or excretions, such as sweat, saliva, sputum, nasal mucus, vomit, urine, or feces, on them.  Wear gloves when handling laundry from the patient.  Read and follow directions on labels of laundry or clothing items and detergent. In general, wash and dry with the warmest temperatures recommended on the label.  Clean all areas the individual has used often  Clean all touchable surfaces, such as counters, tabletops, doorknobs, bathroom fixtures, toilets, phones, keyboards, tablets, and bedside tables, every day. Also, clean any surfaces that may have blood, body fluids, and/or secretions or excretions on them.  Wear gloves when cleaning surfaces the patient has come in contact with.  Use a diluted bleach solution (e.g., dilute bleach with 1  part bleach and 10 parts water) or a household disinfectant with a label that says EPA-registered for coronaviruses. To make a bleach solution at home, add 1 tablespoon of bleach to 1 quart (4 cups) of water. For a larger supply, add  cup of bleach to 1 gallon (16 cups) of water.  Read labels of cleaning products and follow recommendations provided on product labels. Labels contain instructions for safe and effective use of the cleaning product including precautions you should take when applying the product, such as wearing gloves or eye protection  and making sure you have good ventilation during use of the product.  Remove gloves and wash hands immediately after cleaning.  Monitor yourself for signs and symptoms of illness Caregivers and household members are considered close contacts, should monitor their health, and will be asked to limit movement outside of the home to the extent possible. Follow the monitoring steps for close contacts listed on the symptom monitoring form.   ? If you have additional questions, contact your local health department or call the epidemiologist on call at [phone] (available 24/7). ? This guidance is subject to change. For the most up-to-date guidance from [facility], please refer to their website: [email or web address]

## 2018-08-08 NOTE — Plan of Care (Signed)
Pt discharged home. She is alert and oriented x 4. Her vitals were stable. MD notified of patient's blood sugar and states ok to discharge. Both of her iv's were removed. Discharge instructions reviewed with patient and she signed the [place] form and form was placed in patient's chart. Patient verbalized understanding of all discharge instructions. She was discharged via wheelchair by nursing staff.

## 2018-08-08 NOTE — Discharge Summary (Signed)
Lori Crane, is a 63 y.o. female  DOB [birth year]  MRN [id number].  Admission date:  08/05/2018  Admitting Physician  Therisa Doyne, MD  Discharge Date:  08/08/2018   Primary MD  Burnis Medin, PA-C  Recommendations for primary care physician for things to follow:  -Check CBC, BMP during next visit -continue adjustment of her diabetic regimen, hemoglobin A1c is 13   Admission Diagnosis  Acute UTI [N39.0] Severe sepsis (HCC) [A41.9, R65.20] COVID-19 virus infection [U07.1]   Discharge Diagnosis  Acute UTI [N39.0] Severe sepsis (HCC) [A41.9, R65.20] COVID-19 virus infection [U07.1]    Active Problems:   Uncontrolled hypertension   Diabetes mellitus type 2, controlled (HCC)   History of DVT of lower extremity   COVID-19 virus infection   Acute lower UTI   Dehydration   Diarrhea      Past Medical History:  Diagnosis Date  . Diabetes mellitus without complication (HCC)   . Hypercholesterolemia   . Hypertension     Past Surgical History:  Procedure Laterality Date  . ARTERY BIOPSY Left 06/13/2016   Procedure: LEFT TEMPORAL ARTERY BIOPSY;  Surgeon: Maeola Harman, MD;  Location: [facility] OR;  Service: Vascular;  Laterality: Left;       History of present illness and  Hospital Course:     Kindly see H&P for history of present illness and admission details, please review complete Labs, Consult reports and Test reports for all details in brief  HPI  from the history and physical done on the day of admission 08/05/2018  HPI: Lori Crane is a 63 y.o. 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  She has had decreased appetite  lately reports diarrhea this AM   When endorsed any chest pain no coughing no abdominal point no nausea no vomiting patient presented to [facility] on 29 June was found to be febrile not aware that she was running a fever satting 93% on room air Patient was diagnosed with UTI given IV fluids And confusion head CT was done structure showed no signs of infection COVID was done and was negative left patient was started on Keflex and discharged to home Followed up with her primary care provider today but still continued to not feel well and her symptoms have not improved despite Keflex.  She still unable to tolerate good p.o. and still somewhat confused. At that point her primary care provider asked her to present to emergency department States last time she took antibiotics was yesterday  Hypertension was poorly controlled in the past and she required admission for hypertensive urgency in 2018  Last time she has seen her mother who is 53 last week She has been around her cousin who is a Engineer, civil (consulting) for the past 1 week  She has  been her son too  She is retired, has been going [employer]   She has not taken her insulin shot today   Infectious risk factors:  Reports  fever, shortness of breath  In  ER RAPID COVID TEST   POSITIVE,      Hospital Course   Acute metabolic encephalopathy - most likely in the setting of infectious process, both UTI and COVID-19, as will secondary to hypoglycemia from poor appetite on presentation. -CT head with no acute findings,resolved , mentation back to baseline.  UTI -  contributing factor for her encephalopathy, despite  receiving Keflex as an outpatient, and being compliant with it, she continued to be  encephalopathic, treated  with IV Rocephin Rocephin during hospital stay, no need for further antibiotics on discharge  COVID 19 infection -CT chest significant for bilateral opacity due to COVID-19 for pneumonia, is not hypoxic,  but her inflammatory markers are trending up from normal limits, she was started on IV steroids during hospital stay, as well she will be discharged total of 1 week of Decadron as an outpatient..  Diabetes  Mellitus -Poorly controlled, hemoglobin A1c of 13 -She was initially hypoglycemic multiple times during hospital stay despite being off her regimen, the day of discharge she became hyperglycemic with blood sugar in the 300s range, to be discharged on her regimen without adjustment, as I would like to adjust her insulin regimen with A1c of 13, but given the fact was hypoglycemic multiple times during hospital stay I will avoid to stop currently .  Hypertension - blood pressure been soft during hospital stay, her medications were held, it did start to increase, she was resumed on her home medication at time of discharge  COVID-19 Labs  Recent Labs (last 2 labs)        Recent Labs    08/05/18 2027 08/06/18 0738 08/07/18 0310  DDIMER  --   --  0.39  FERRITIN 285 271 275  CRP 1.3* 1.5* 2.1*      Recent Labs       Lab Results  Component Value Date   SARSCOV2NAA POSITIVE (A) 08/05/2018   SARSCOV2NAA NEGATIVE 08/03/2018     Diarrhea -Secondary to COVID, present, improved , encouraged to increase her fluid intake.  Hypokalemia -Repleted  History of DVT -Reports she had been on Xarelto for 1-1/2 years, which had been discontinued recently, venous Doppler done by [facility] on 6/23 is negative  for any DVT.    Discharge Condition:  stable  Follow UP  Follow-up Information    [place], [place] E, PA-C Follow up in 1 week(s).   Specialty: Family Medicine Contact information: [address] [place] [address] [phone]             Discharge Instructions  and  Discharge Medications    Discharge Instructions    Discharge instructions   Complete by: As directed    Follow with Primary MD Piedad Climes, [place],  PA-C in 7 days   Get CBC, CMP,  checked  by Primary MD next visit.    Activity: As tolerated with Full fall precautions use walker/cane & assistance as needed   Disposition Home    Diet: Heart Healthy , Carb modified , with feeding assistance and aspiration precautions.  For Heart failure patients - Check your Weight same time everyday, if you gain over 2 pounds, or you develop in leg swelling, experience more shortness of breath or chest pain, call your  Primary MD immediately. Follow Cardiac Low Salt Diet and 1.5 lit/day fluid restriction.   On your next visit with your primary care physician please Get Medicines reviewed and adjusted.   Please request your Prim.MD to go over all Hospital Tests and Procedure/Radiological results at the follow up, please get all Hospital records sent to your Prim MD by signing hospital release before you go home.   If you experience worsening of your admission symptoms, develop shortness of breath, life threatening emergency, suicidal or homicidal thoughts you must seek medical attention immediately by calling 911 or calling your MD immediately  if symptoms less severe.  You Must read complete instructions/literature along with all the possible adverse reactions/side effects for all the Medicines you take and that have been prescribed to you. Take any new Medicines after you have completely understood and accpet all the possible adverse reactions/side effects.   Do not drive, operating heavy machinery, perform activities at heights, swimming or participation in water activities or provide baby sitting services if your were admitted for syncope or siezures until you have seen by Primary MD or a Neurologist and advised to do so again.  Do not drive when taking Pain medications.    Do not take more than prescribed Pain, Sleep and Anxiety Medications  Special Instructions: If you have smoked or chewed Tobacco  in the last 2 yrs please stop smoking, stop  any regular Alcohol  and or any Recreational drug use.  Wear Seat belts while driving.   Please note  You were cared for by a hospitalist during your hospital stay. If you have any questions about your discharge medications or the care you received while you were in the hospital after you are discharged, you can call the unit and asked to speak with the hospitalist on call if the hospitalist that took care of you is not available. Once you are discharged, your primary care physician will handle any further medical issues. Please note that NO REFILLS for any discharge medications will be authorized once you are discharged, as it is imperative that you return to your primary care physician (or establish a relationship with a primary care physician if you do not have one) for your aftercare needs so that they can reassess your need for medications and monitor your lab values.   Increase activity slowly   Complete by: As directed    MyChart COVID-19 home monitoring program   Complete by: Aug 08, 2018    Is the patient willing to use the MyChart Mobile App for home monitoring?: Yes   Temperature monitoring   Complete by: Aug 08, 2018    After how many days would you like to receive a notification of this patient's flowsheet entries?: 1     Allergies as of 08/08/2018      Reactions   Metformin And Related Other (See Comments)   Severe Yeast infection      Medication List    STOP taking these medications   cephALEXin 500 MG capsule Commonly known as: KEFLEX   fluconazole 150 MG tablet Commonly known as: DIFLUCAN   ondansetron 4 MG tablet Commonly known as: ZOFRAN   oxyCODONE-acetaminophen 5-325 MG tablet Commonly known as: PERCOCET/ROXICET   predniSONE 20 MG tablet Commonly known as: DELTASONE   rivaroxaban 20 MG Tabs tablet Commonly known as: XARELTO   sitaGLIPtin-metformin 50-1000 MG tablet Commonly known as: Janumet     TAKE these medications   acetaminophen 325 MG tablet  Commonly known as:  TYLENOL Take 2 tablets (650 mg total) by mouth every 6 (six) hours as needed for mild pain, fever or headache.   cloNIDine 0.1 MG tablet Commonly known as: CATAPRES Take 0.1 mg by mouth 2 (two) times daily.   dexamethasone 6 MG tablet Commonly known as: DECADRON Take 1 tablet (6 mg total) by mouth 2 (two) times daily with a meal. Start taking on: August 09, 2018   DULoxetine 30 MG capsule Commonly known as: CYMBALTA Take 30 mg by mouth daily.   gabapentin 300 MG capsule Commonly known as: NEURONTIN Take 300 mg by mouth 3 (three) times daily as needed (pain).   glimepiride 4 MG tablet Commonly known as: AMARYL Take 4 mg by mouth 2 (two) times a day.   hydrALAZINE 50 MG tablet Commonly known as: APRESOLINE Take 1 tablet (50 mg total) by mouth every 8 (eight) hours.   HYDROcodone-acetaminophen 5-325 MG tablet Commonly known as: NORCO/VICODIN Take 1 tablet by mouth every 4 (four) hours as needed.   insulin degludec 100 UNIT/ML Sopn FlexTouch Pen Commonly known as: TRESIBA Inject 50 Units into the skin daily.   Jardiance 25 MG Tabs tablet Generic drug: empagliflozin Take 25 mg by mouth daily.   methocarbamol 500 MG tablet Commonly known as: ROBAXIN Take 1 tablet (500 mg total) by mouth 2 (two) times daily.   metoprolol tartrate 50 MG tablet Commonly known as: LOPRESSOR Take 1 tablet (50 mg total) by mouth 2 (two) times daily.   pantoprazole 40 MG tablet Commonly known as: PROTONIX Take 1 tablet (40 mg total) by mouth daily.   simvastatin 20 MG tablet Commonly known as: ZOCOR Take 20 mg by mouth every evening.   telmisartan-hydrochlorothiazide 80-25 MG tablet Commonly known as: MICARDIS HCT Take 1 tablet by mouth daily. Resume on 08/12/2018 Start taking on: August 12, 2018 What changed:   additional instructions  These instructions start on August 12, 2018. If you are unsure what to do until then, ask your doctor or other care provider.          Diet and Activity recommendation: See Discharge Instructions above   Consults obtained -  None   Major procedures and Radiology Reports - PLEASE review detailed and final reports for all details, in brief -     Ct Head Wo Contrast  Result Date: 08/03/2018 CLINICAL DATA:  Confusion, shortness of breath, fatigue EXAM: CT HEAD WITHOUT CONTRAST TECHNIQUE: Contiguous axial images were obtained from the base of the skull through the vertex without intravenous contrast. COMPARISON:  06/10/2016 FINDINGS: Brain: No evidence of acute infarction, hemorrhage, hydrocephalus, extra-axial collection or mass lesion/mass effect. Vascular: No hyperdense vessel or unexpected calcification. Skull: Normal. Negative for fracture or focal lesion. Sinuses/Orbits: No acute finding. Other: None. IMPRESSION: No acute intracranial pathology. Electronically Signed   By: Lauralyn Primes M.D.   On: 08/03/2018 21:19   Ct Angio Chest Pe W And/or Wo Contrast  Result Date: 08/05/2018 CLINICAL DATA:  63 year old female with COVID-19, increased confusion. EXAM: CT ANGIOGRAPHY CHEST WITH CONTRAST TECHNIQUE: Multidetector CT imaging of the chest was performed using the standard protocol during bolus administration of intravenous contrast. Multiplanar CT image reconstructions and MIPs were obtained to evaluate the vascular anatomy. CONTRAST:  OMNIPAQUE IOHEXOL 350 MG/ML SOLN COMPARISON:  Portable chest earlier today. CT Abdomen and Pelvis 07/09/2013. FINDINGS: Cardiovascular: Good contrast bolus timing in the pulmonary arterial tree. Mild respiratory motion, especially in the lower lobes. No focal filling defect identified in the pulmonary arteries to suggest  acute pulmonary embolism. Calcified aortic atherosclerosis. Mild cardiomegaly. No pericardial effusion. Mediastinum/Nodes: Negative, no mediastinal lymphadenopathy. Lungs/Pleura: Major airways are patent. There is mild scattered bilateral pulmonary ground-glass opacity, 1 of the  more pronounced areas is at the posterior inferior right upper lobe on series 10, image 62. Lung volumes are also lower, so some of the lower lobe ground-glass opacity is probably superimposed atelectasis. No pleural effusion or consolidation. Upper Abdomen: Negative visible liver, spleen, pancreas, adrenal glands and bowel. Possible small gastric hiatal hernia. Stable partially visible right renal cortical scarring. Musculoskeletal: No acute osseous abnormality identified. Review of the MIP images confirms the above findings. IMPRESSION: 1.  Negative for acute pulmonary embolus. 2. Atelectasis with superimposed scattered bilateral ground-glass opacity compatible with COVID-19 pneumonia. No pleural effusion. 3.  Aortic Atherosclerosis (ICD10-I70.0). Electronically Signed   By: Odessa Fleming M.D.   On: 08/05/2018 21:53   Dg Chest Port 1 View  Result Date: 08/05/2018 CLINICAL DATA:  Fever EXAM: PORTABLE CHEST 1 VIEW COMPARISON:  August 03, 2018 FINDINGS: There is a somewhat indistinct right heart border. No pneumothorax. No large pleural effusion. There is some blunting of the left costophrenic angle. There is no acute osseous abnormality. The heart size is stable from prior study. Lung volumes remain somewhat low. IMPRESSION: 1. Somewhat indistinct right heart border in comparison to prior study. Findings could represent a developing infiltrate or atelectasis. 2. Low lung volumes. Left basilar airspace opacities are favored to represent atelectasis, however an infiltrate is not excluded. Electronically Signed   By: Katherine Mantle M.D.   On: 08/05/2018 18:12   Dg Chest Port 1 View  Result Date: 08/03/2018 CLINICAL DATA:  63 year old female with confusion, shortness of breath and fatigue for 2 days. Fever. Negative for COVID-19. Today. EXAM: PORTABLE CHEST 1 VIEW COMPARISON:  Chest radiographs 05/04/2016 and earlier. FINDINGS: Portable AP semi upright view at 2029 hours. Lower lung volumes. Normal cardiac size and  mediastinal contours. Visualized tracheal air column is within normal limits. Allowing for portable technique the lungs are clear. No pneumothorax. Paucity of bowel gas in the upper abdomen. No acute osseous abnormality identified. IMPRESSION: Lower lung volumes.  No acute cardiopulmonary abnormality. Electronically Signed   By: Odessa Fleming M.D.   On: 08/03/2018 21:23    Micro Results     Recent Results (from the past 240 hour(s))  SARS Coronavirus 2 (Hosp order,Performed in [facility] lab via Abbott ID)     Status: None   Collection Time: 08/03/18  8:47 PM   Specimen: Dry Nasal Swab (Abbott ID Now)  Result Value Ref Range Status   SARS Coronavirus 2 (Abbott ID Now) NEGATIVE NEGATIVE Final    Comment: (NOTE) SARS-CoV-2 target nucleic acids are NOT DETECTED. The SARS-CoV-2 RNA is generally detectable in upper and lower respiratory specimens during the acute phase of infection.  Negativeresults do not preclude SARS-CoV-2 infection, do not rule out coinfections with other pathogens, and should not be used as the  sole basis for treatment or other patient management decisions.  Negative results must be combined with clinical observations, patient history, and epidemiological information. The expected result is Negative. Fact Sheet for Patients: http://www.graves-ford.org/ Fact Sheet for Healthcare Providers: EnviroConcern.si This test is not yet approved or cleared by the [place] FDA and  has been authorized for detection and/or diagnosis of SARS-CoV-2 by FDA under an Emergency Use Authorization (EUA).  This EUA will remain in effect (meaning this test can be used) for the duration of  the  COVID19 declaration under Section 5 64(b)(1) of the Act, 21 U.S.C.  section [id number] 3(b)(1), unless the authorization is terminated or revoked sooner. Performed at [facility] [place], [address] [place], [place] [address]   Urine culture      Status: None   Collection Time: 08/05/18  5:27 PM   Specimen: Urine, Clean Catch  Result Value Ref Range Status   Specimen Description   Final    URINE, CLEAN CATCH Performed at [facility], [address] [place], [place] [address]    Special Requests   Final    NONE Performed at [facility], [address] [place], [place] [address]    Culture   Final    NO GROWTH Performed at [facility], [address] [place], [place] [address]    Report Status 08/06/2018 FINAL  Final  SARS Coronavirus 2 (CEPHEID - Performed in [facility] hospital lab), Hosp Order     Status: Abnormal   Collection Time: 08/05/18  5:27 PM   Specimen: Nasopharyngeal Swab  Result Value Ref Range Status   SARS Coronavirus 2 POSITIVE (A) NEGATIVE Final    Comment: RESULT CALLED TO, READ BACK BY AND VERIFIED WITH: BULLOCK,A RN @1952  ON 08/05/2018 JACKSON,K (NOTE) If result is NEGATIVE SARS-CoV-2 target nucleic acids are NOT DETECTED. The SARS-CoV-2 RNA is generally detectable in upper and lower  respiratory specimens during the acute phase of infection. The lowest  concentration of SARS-CoV-2 viral copies this assay can detect is 250  copies / mL. A negative result does not preclude SARS-CoV-2 infection  and should not be used as the sole basis for treatment or other  patient management decisions.  A negative result may occur with  improper specimen collection / handling, submission of specimen other  than nasopharyngeal swab, presence of viral mutation(s) within the  areas targeted by this assay, and inadequate number of viral copies  (<250 copies / mL). A negative result must be combined with clinical  observations, patient history, and epidemiological information. If result is POSITIVE SARS-CoV-2 target nucleic acids are DETECTE D. The SARS-CoV-2 RNA is generally detectable in upper and lower  respiratory specimens during the acute phase of infection.   Positive  results are indicative of active infection with SARS-CoV-2.  Clinical  correlation with patient history and other diagnostic information is  necessary to determine patient infection status.  Positive results do  not rule out bacterial infection or co-infection with other viruses. If result is PRESUMPTIVE POSTIVE SARS-CoV-2 nucleic acids MAY BE PRESENT.   A presumptive positive result was obtained on the submitted specimen  and confirmed on repeat testing.  While 2019 novel coronavirus  (SARS-CoV-2) nucleic acids may be present in the submitted sample  additional confirmatory testing may be necessary for epidemiological  and / or clinical management purposes  to differentiate between  SARS-CoV-2 and other Sarbecovirus currently known to infect humans.  If clinically indicated additional testing with an alternate test  methodology (LAB745 3) is advised. The SARS-CoV-2 RNA is generally  detectable in upper and lower respiratory specimens during the acute  phase of infection. The expected result is Negative. Fact Sheet for Patients:  BoilerBrush.com.cyhttps://www.fda.gov/media/136312/download Fact Sheet for Healthcare Providers: https://pope.com/https://www.fda.gov/media/136313/download This test is not yet approved or cleared by the [place] FDA and has been authorized for detection and/or diagnosis of SARS-CoV-2 by FDA under an Emergency Use Authorization (EUA).  This EUA will remain in effect (meaning this test can  be used) for the duration of the COVID-19 declaration under Section 564(b)(1) of the Act, 21 U.S.C. section 360bbb-3(b)(1), unless the authorization is terminated or revoked sooner. Performed at [facility], [address] [place], [place] [address]   Blood Culture (routine x 2)     Status: None (Preliminary result)   Collection Time: 08/05/18  5:32 PM   Specimen: BLOOD  Result Value Ref Range Status   Specimen Description   Final    BLOOD RIGHT ANTECUBITAL Performed  at [facility], [address] [place], [place] [address]    Special Requests   Final    BOTTLES DRAWN AEROBIC AND ANAEROBIC Blood Culture adequate volume Performed at [facility], [address] [place], [place] [address]    Culture   Final    NO GROWTH 2 DAYS Performed at [facility], [address] [place], [place] [address]    Report Status PENDING  Incomplete  Blood Culture (routine x 2)     Status: None (Preliminary result)   Collection Time: 08/05/18  5:43 PM   Specimen: BLOOD  Result Value Ref Range Status   Specimen Description   Final    BLOOD RIGHT ANTECUBITAL Performed at [facility], [address] [place], [place] [address]    Special Requests   Final    Blood Culture adequate volume Performed at [facility], [address] [place], [place] [address]    Culture   Final    NO GROWTH 2 DAYS Performed at [facility], [address] [place], [place] [address]    Report Status PENDING  Incomplete       Today   Subjective:   [place] today has no headache,no chest or abdominal pain,no new weakness tingling or numbness, feels much better wants to go home today.  Reports her diarrhea has improved.  Objective:   Blood pressure (!) 164/80, pulse 63, temperature 98 F (36.7 C), temperature source Oral, resp. rate 19, height 5\' 6"  (1.676 m), weight 77.9 kg, SpO2 91 %.   Intake/Output Summary (Last 24 hours) at 08/08/2018 1145 Last data filed at 08/08/2018 0500 Gross per 24 hour  Intake 1305 ml  Output 2 ml  Net 1303 ml    Exam Awake Alert, Oriented x 3, No new F.N deficits, Normal affect Symmetrical Chest wall movement, Good air movement bilaterally, CTAB RRR,No Gallops,Rubs or new Murmurs, No Parasternal Heave +ve B.Sounds, Abd Soft, Non tender, No rebound -guarding or rigidity. No Cyanosis, Clubbing or edema, No new Rash or bruise  Data Review    CBC w Diff:  Lab Results  Component Value Date   WBC 2.7 (L) 08/08/2018   HGB 13.0 08/08/2018   HCT 41.4 08/08/2018   PLT 187 08/08/2018   LYMPHOPCT 25 08/06/2018   MONOPCT 5 08/06/2018   EOSPCT 0 08/06/2018   BASOPCT 0 08/06/2018    CMP:  Lab Results  Component Value Date   NA 138 08/08/2018   K 5.0 08/08/2018   CL 102 08/08/2018   CO2 25 08/08/2018   BUN 17 08/08/2018   CREATININE 0.78 08/08/2018   PROT 7.5 08/08/2018   ALBUMIN 2.8 (L) 08/08/2018   BILITOT 0.4 08/08/2018   ALKPHOS 47 08/08/2018   AST 32 08/08/2018   ALT 16 08/08/2018  .   Total Time in preparing paper work, data evaluation and todays exam - 35 minutes  Huey Bienenstockawood Yasmine Kilbourne M.D on 08/08/2018  at 11:45 AM  [facility]   Office  [phone]

## 2018-08-08 NOTE — Plan of Care (Signed)
Patient is alert and oriented x 4. Her vitals remain stable. Her blood sugar is elevated. MD aware, see updated orders.

## 2018-08-08 NOTE — Plan of Care (Signed)
Patient adequate for discharge.

## 2018-08-08 NOTE — Plan of Care (Signed)
Notified MD, patient's blood sugar was 405. Ordered a STAT glucose lab draw. Awaiting lab draw and MD's orders.

## 2018-08-10 LAB — GLUCOSE, CAPILLARY
Glucose-Capillary: 104 mg/dL — ABNORMAL HIGH (ref 70–99)
Glucose-Capillary: 273 mg/dL — ABNORMAL HIGH (ref 70–99)

## 2018-08-10 LAB — CULTURE, BLOOD (ROUTINE X 2)
Culture: NO GROWTH
Culture: NO GROWTH
Special Requests: ADEQUATE
Special Requests: ADEQUATE

## 2020-04-03 IMAGING — CT CT ANGIOGRAPHY CHEST
2 of 6 series · 18 of 46 positions shown · IV contrast (ISOVUE)
Comparison: Portable chest earlier today. CT Abdomen and Pelvis
07/09/2013.

CLINICAL DATA: 62-year-old female with LPMD3-H3, increased
confusion.

EXAM:
CT ANGIOGRAPHY CHEST WITH CONTRAST
TECHNIQUE: Multidetector CT imaging of the chest was performed using the
standard protocol during bolus administration of intravenous
contrast. Multiplanar CT image reconstructions and MIPs were
obtained to evaluate the vascular anatomy.
CONTRAST:  100mL OMNIPAQUE IOHEXOL 350 MG/ML SOLN

[Series 5: thins · axial · 0.68mm/px · z∈[-290,-52]mm · 15 of 262 slices shown]
[im 12/262  lung]
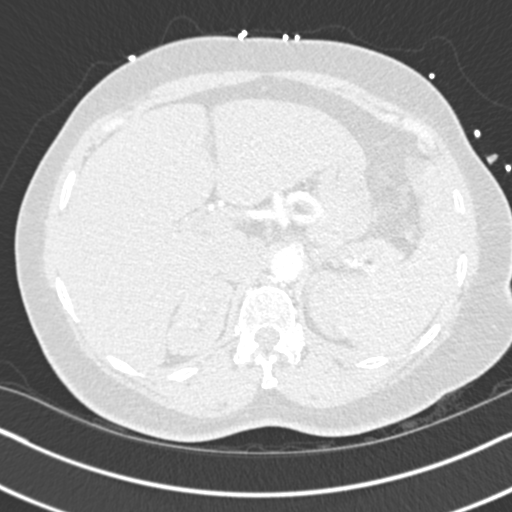
[im 35/262  soft-tissue]
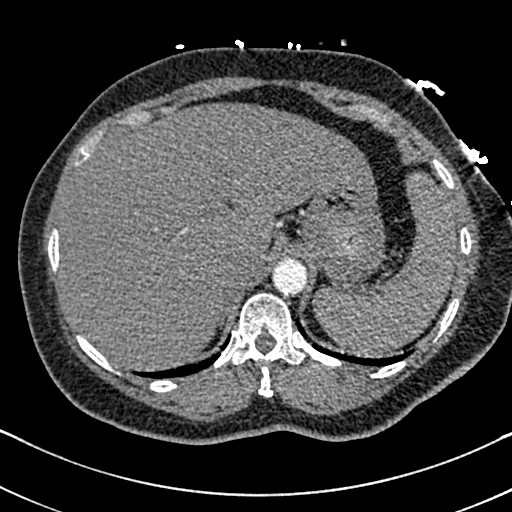
[im 46/262  lung]
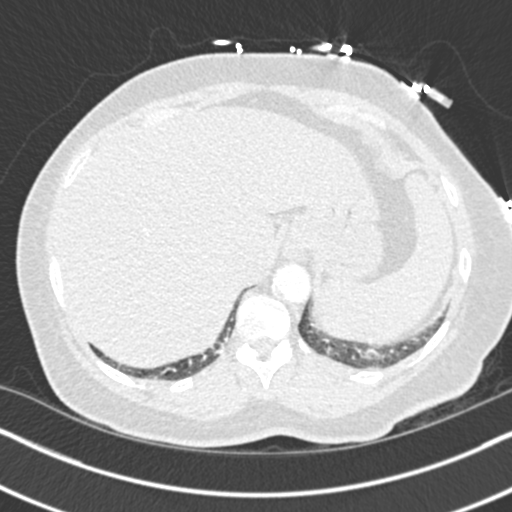
[im 69/262  soft-tissue]
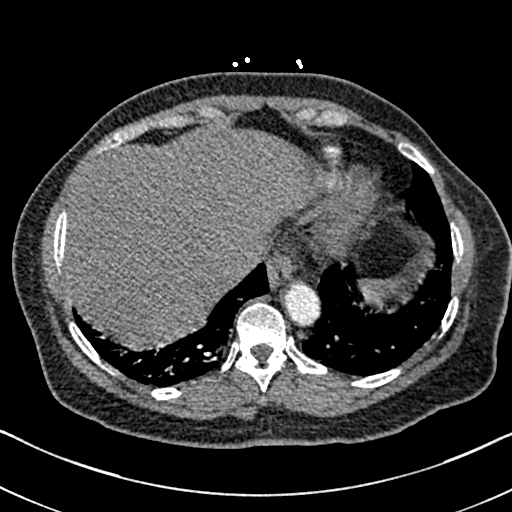
[im 80/262  lung]
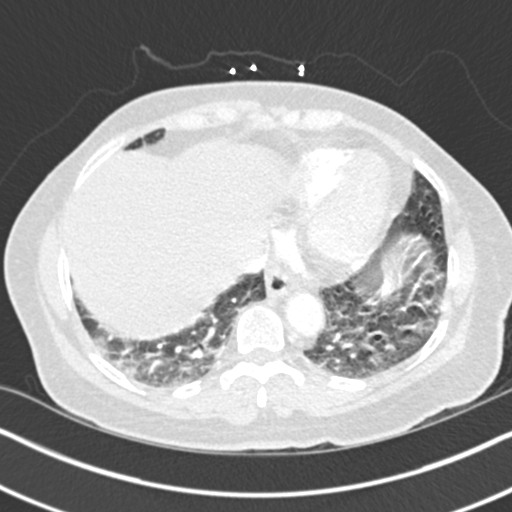
[im 103/262  soft-tissue]
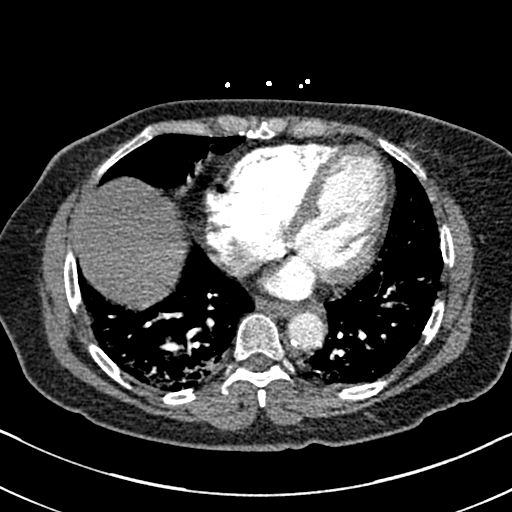
[im 114/262  lung]
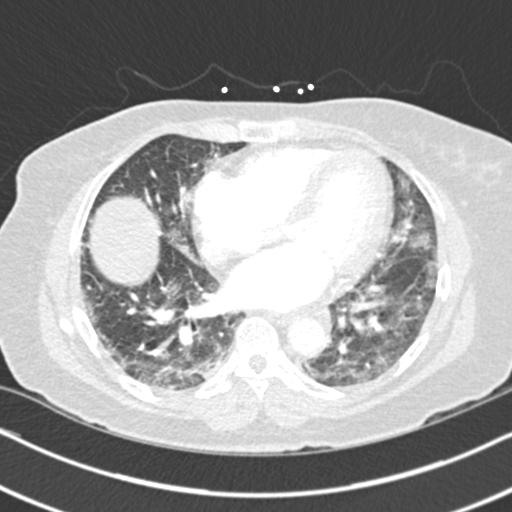
[im 137/262  soft-tissue]
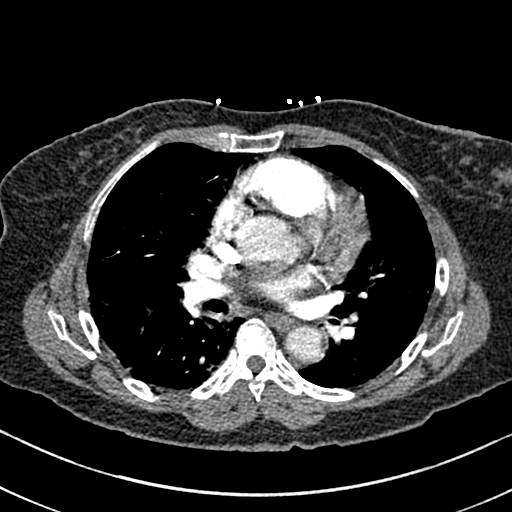
[im 148/262  lung]
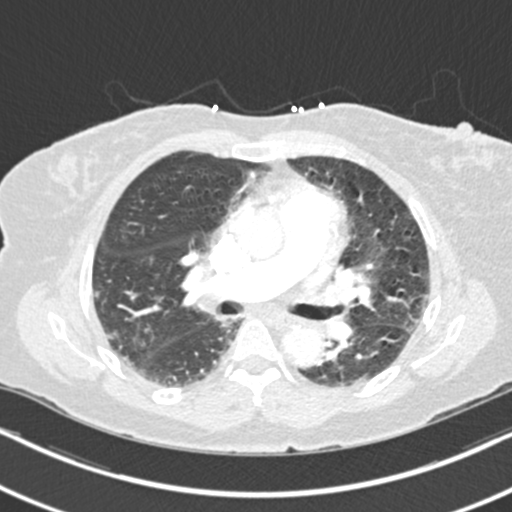
[im 159/262  soft-tissue]
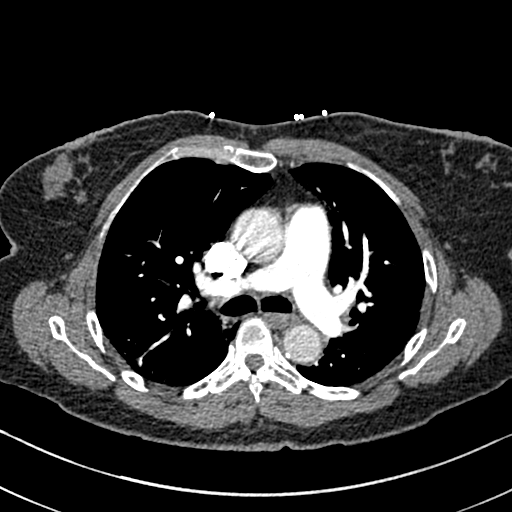
[im 182/262  lung]
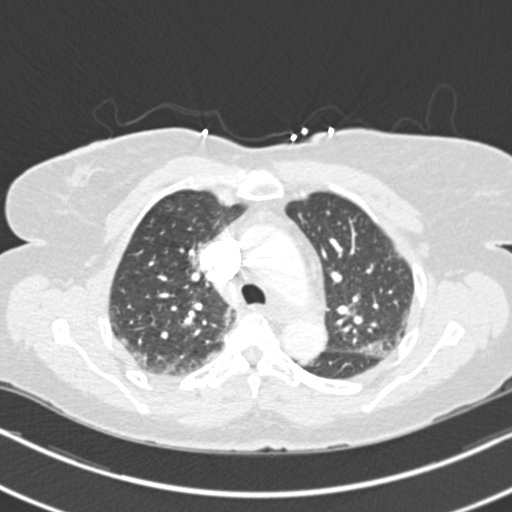
[im 193/262  soft-tissue]
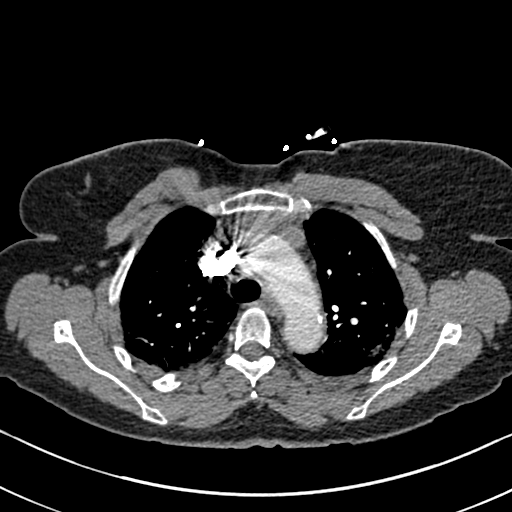
[im 216/262  lung]
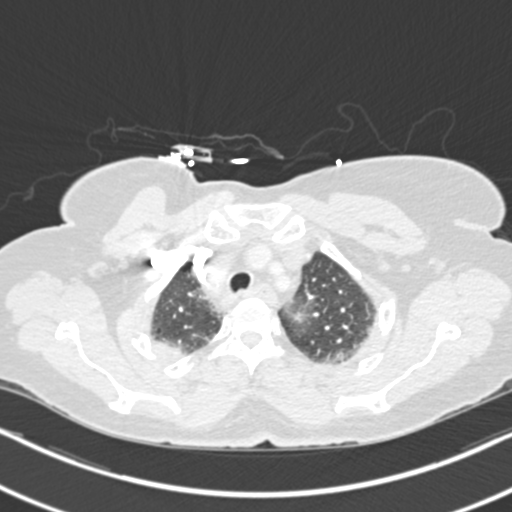
[im 227/262  soft-tissue]
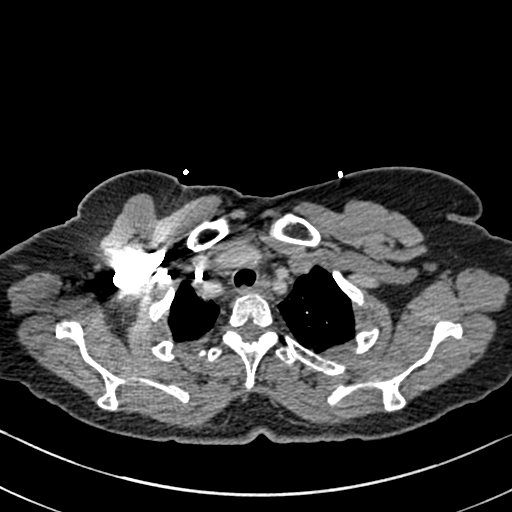
[im 250/262  lung]
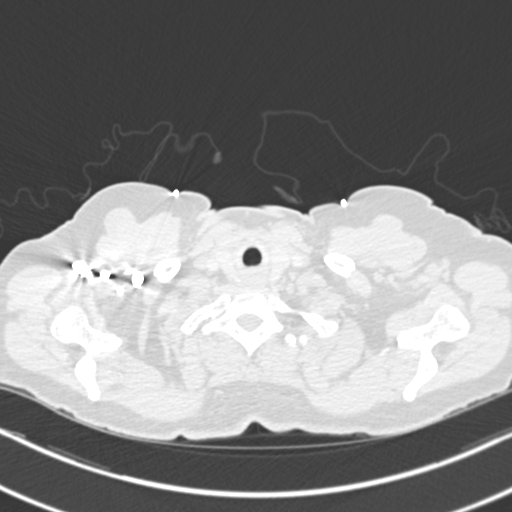

[Series 6: coronal mpr · coronal · 0.54mm/px · 3 of 139 slices shown]
[im 35/139  soft-tissue]
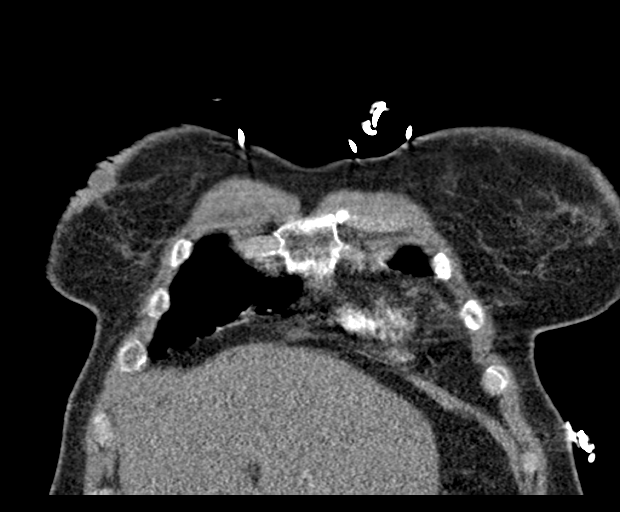
[im 70/139  soft-tissue]
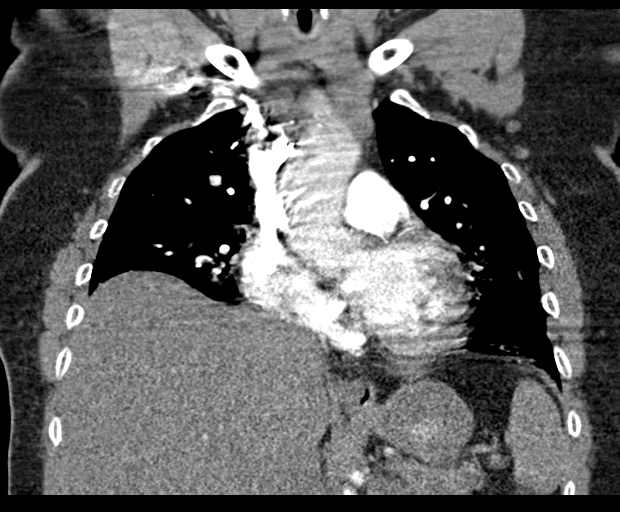
[im 104/139  soft-tissue]
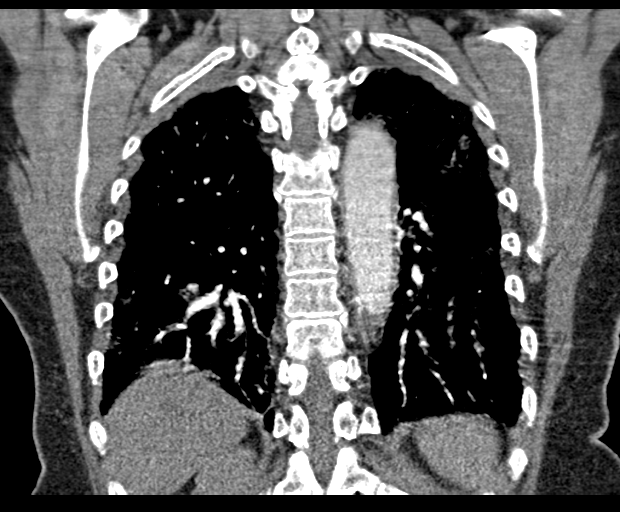

[18 of 46 positions shown; findings below may reference images not displayed]

FINDINGS: Cardiovascular: Good contrast bolus timing in the pulmonary arterial
tree. Mild respiratory motion, especially in the lower lobes.

No focal filling defect identified in the pulmonary arteries to
suggest acute pulmonary embolism.

Calcified aortic atherosclerosis. Mild cardiomegaly. No pericardial
effusion.

Mediastinum/Nodes: Negative, no mediastinal lymphadenopathy.

Lungs/Pleura: Major airways are patent. There is mild scattered
bilateral pulmonary ground-glass opacity, 1 of the more pronounced
areas is at the posterior inferior right upper lobe on series 10,
image 62. Lung volumes are also lower, so some of the lower lobe
ground-glass opacity is probably superimposed atelectasis. No
pleural effusion or consolidation.

Upper Abdomen: Negative visible liver, spleen, pancreas, adrenal
glands and bowel. Possible small gastric hiatal hernia. Stable
partially visible right renal cortical scarring.

Musculoskeletal: No acute osseous abnormality identified.

Review of the MIP images confirms the above findings.
IMPRESSION: 1.  Negative for acute pulmonary embolus.
2. Atelectasis with superimposed scattered bilateral ground-glass
opacity compatible with LPMD3-H3 pneumonia. No pleural effusion.
3.  Aortic Atherosclerosis ([id number]).

## 2021-09-09 ENCOUNTER — Other Ambulatory Visit: Payer: Self-pay

## 2021-09-09 ENCOUNTER — Encounter (HOSPITAL_COMMUNITY): Payer: Self-pay

## 2021-09-09 ENCOUNTER — Inpatient Hospital Stay (HOSPITAL_COMMUNITY)
Admission: EM | Admit: 2021-09-09 | Discharge: 2021-09-12 | DRG: 641 | Disposition: A | Discharge status: Home / Self Care | Payer: Medicare Other | Attending: Internal Medicine | Admitting: Internal Medicine

## 2021-09-09 DIAGNOSIS — Z794 Long term (current) use of insulin: Secondary | ICD-10-CM

## 2021-09-09 DIAGNOSIS — E870 Hyperosmolality and hypernatremia: Secondary | ICD-10-CM | POA: Diagnosis present

## 2021-09-09 DIAGNOSIS — R531 Weakness: Principal | ICD-10-CM

## 2021-09-09 DIAGNOSIS — Z8249 Family history of ischemic heart disease and other diseases of the circulatory system: Secondary | ICD-10-CM

## 2021-09-09 DIAGNOSIS — E1140 Type 2 diabetes mellitus with diabetic neuropathy, unspecified: Secondary | ICD-10-CM | POA: Diagnosis present

## 2021-09-09 DIAGNOSIS — Z87891 Personal history of nicotine dependence: Secondary | ICD-10-CM

## 2021-09-09 DIAGNOSIS — Z79899 Other long term (current) drug therapy: Secondary | ICD-10-CM

## 2021-09-09 DIAGNOSIS — Z888 Allergy status to other drugs, medicaments and biological substances status: Secondary | ICD-10-CM

## 2021-09-09 DIAGNOSIS — E785 Hyperlipidemia, unspecified: Secondary | ICD-10-CM | POA: Diagnosis present

## 2021-09-09 DIAGNOSIS — E876 Hypokalemia: Principal | ICD-10-CM | POA: Diagnosis present

## 2021-09-09 DIAGNOSIS — D649 Anemia, unspecified: Secondary | ICD-10-CM | POA: Diagnosis present

## 2021-09-09 DIAGNOSIS — I10 Essential (primary) hypertension: Secondary | ICD-10-CM | POA: Diagnosis present

## 2021-09-09 DIAGNOSIS — E1165 Type 2 diabetes mellitus with hyperglycemia: Secondary | ICD-10-CM | POA: Diagnosis present

## 2021-09-09 DIAGNOSIS — Z7984 Long term (current) use of oral hypoglycemic drugs: Secondary | ICD-10-CM

## 2021-09-09 DIAGNOSIS — K047 Periapical abscess without sinus: Secondary | ICD-10-CM | POA: Diagnosis present

## 2021-09-09 DIAGNOSIS — E7800 Pure hypercholesterolemia, unspecified: Secondary | ICD-10-CM | POA: Diagnosis present

## 2021-09-09 DIAGNOSIS — M6282 Rhabdomyolysis: Secondary | ICD-10-CM | POA: Diagnosis present

## 2021-09-09 DIAGNOSIS — R7989 Other specified abnormal findings of blood chemistry: Secondary | ICD-10-CM | POA: Diagnosis present

## 2021-09-09 LAB — COMPREHENSIVE METABOLIC PANEL
ALT: 67 U/L — ABNORMAL HIGH (ref 0–44)
AST: 180 U/L — ABNORMAL HIGH (ref 15–41)
Albumin: 3.2 g/dL — ABNORMAL LOW (ref 3.5–5.0)
Alkaline Phosphatase: 78 U/L (ref 38–126)
Anion gap: 12 (ref 5–15)
BUN: <5 mg/dL — ABNORMAL LOW (ref 8–23)
CO2: 36 mmol/L — ABNORMAL HIGH (ref 22–32)
Calcium: 9.3 mg/dL (ref 8.9–10.3)
Chloride: 97 mmol/L — ABNORMAL LOW (ref 98–111)
Creatinine, Ser: 0.91 mg/dL (ref 0.44–1.00)
GFR, Estimated: >60 mL/min (ref >60)
Glucose, Bld: 253 mg/dL — ABNORMAL HIGH (ref 70–99)
Potassium: <2 mmol/L — CL (ref 3.5–5.1)
Sodium: 145 mmol/L (ref 135–145)
Total Bilirubin: 0.9 mg/dL (ref 0.3–1.2)
Total Protein: 7.4 g/dL (ref 6.5–8.1)

## 2021-09-09 LAB — BASIC METABOLIC PANEL
Anion gap: 8 (ref 5–15)
Anion gap: 6 (ref 5–15)
BUN: <5 mg/dL — ABNORMAL LOW (ref 8–23)
BUN: <5 mg/dL — ABNORMAL LOW (ref 8–23)
CO2: 37 mmol/L — ABNORMAL HIGH (ref 22–32)
CO2: 34 mmol/L — ABNORMAL HIGH (ref 22–32)
Calcium: 8.4 mg/dL — ABNORMAL LOW (ref 8.9–10.3)
Calcium: 8 mg/dL — ABNORMAL LOW (ref 8.9–10.3)
Chloride: 101 mmol/L (ref 98–111)
Chloride: 104 mmol/L (ref 98–111)
Creatinine, Ser: 0.82 mg/dL (ref 0.44–1.00)
Creatinine, Ser: 0.66 mg/dL (ref 0.44–1.00)
GFR, Estimated: >60 mL/min (ref >60)
GFR, Estimated: >60 mL/min (ref >60)
Glucose, Bld: 253 mg/dL — ABNORMAL HIGH (ref 70–99)
Glucose, Bld: 155 mg/dL — ABNORMAL HIGH (ref 70–99)
Potassium: <2 mmol/L — CL (ref 3.5–5.1)
Potassium: <2 mmol/L — CL (ref 3.5–5.1)
Sodium: 146 mmol/L — ABNORMAL HIGH (ref 135–145)
Sodium: 144 mmol/L (ref 135–145)

## 2021-09-09 LAB — URINALYSIS, ROUTINE W REFLEX MICROSCOPIC
Bacteria, UA: NONE SEEN
Bilirubin Urine: NEGATIVE
Glucose, UA: >=500 mg/dL — AB
Ketones, ur: NEGATIVE mg/dL
Leukocytes,Ua: NEGATIVE
Nitrite: NEGATIVE
Protein, ur: 30 mg/dL — AB
Specific Gravity, Urine: 1.008 (ref 1.005–1.030)
pH: 8 (ref 5.0–8.0)

## 2021-09-09 LAB — CBC WITH DIFFERENTIAL/PLATELET
Abs Immature Granulocytes: 0.04 10*3/uL (ref 0.00–0.07)
Basophils Absolute: 0 10*3/uL (ref 0.0–0.1)
Basophils Relative: 0 %
Eosinophils Absolute: 0.1 10*3/uL (ref 0.0–0.5)
Eosinophils Relative: 2 %
HCT: 39.8 % (ref 36.0–46.0)
Hemoglobin: 12.9 g/dL (ref 12.0–15.0)
Immature Granulocytes: 1 %
Lymphocytes Relative: 18 %
Lymphs Abs: 1.3 10*3/uL (ref 0.7–4.0)
MCH: 27 pg (ref 26.0–34.0)
MCHC: 32.4 g/dL (ref 30.0–36.0)
MCV: 83.3 fL (ref 80.0–100.0)
Monocytes Absolute: 0.4 10*3/uL (ref 0.1–1.0)
Monocytes Relative: 6 %
Neutro Abs: 5.3 10*3/uL (ref 1.7–7.7)
Neutrophils Relative %: 73 %
Platelets: 216 10*3/uL (ref 150–400)
RBC: 4.78 MIL/uL (ref 3.87–5.11)
RDW: 14.5 % (ref 11.5–15.5)
WBC: 7.2 10*3/uL (ref 4.0–10.5)
nRBC: 0 % (ref 0.0–0.2)

## 2021-09-09 LAB — CBG MONITORING, ED
Glucose-Capillary: 225 mg/dL — ABNORMAL HIGH (ref 70–99)
Glucose-Capillary: 336 mg/dL — ABNORMAL HIGH (ref 70–99)

## 2021-09-09 LAB — HEMOGLOBIN A1C
Hgb A1c MFr Bld: 9.8 % — ABNORMAL HIGH (ref 4.8–5.6)
Mean Plasma Glucose: 234.56 mg/dL

## 2021-09-09 LAB — GLUCOSE, CAPILLARY
Glucose-Capillary: 176 mg/dL — ABNORMAL HIGH (ref 70–99)
Glucose-Capillary: 116 mg/dL — ABNORMAL HIGH (ref 70–99)

## 2021-09-09 LAB — MRSA NEXT GEN BY PCR, NASAL: MRSA by PCR Next Gen: NOT DETECTED

## 2021-09-09 LAB — HIV ANTIBODY (ROUTINE TESTING W REFLEX): HIV Screen 4th Generation wRfx: NONREACTIVE

## 2021-09-09 LAB — MAGNESIUM: Magnesium: 2.6 mg/dL — ABNORMAL HIGH (ref 1.7–2.4)

## 2021-09-09 LAB — CK: Total CK: 10677 U/L — ABNORMAL HIGH (ref 38–234)

## 2021-09-09 MED ORDER — POTASSIUM CHLORIDE 10 MEQ/100ML IV SOLN
10.0000 meq | INTRAVENOUS | Status: AC
  Administered 2021-09-09 – 2021-09-10 (×4): 10 meq via INTRAVENOUS
  Filled 2021-09-09 (×4): qty 100

## 2021-09-09 MED ORDER — ACETAMINOPHEN 325 MG PO TABS: 650.0000 mg | ORAL_TABLET | Freq: Four times a day (QID) | ORAL | Status: DC | PRN

## 2021-09-09 MED ORDER — ROSUVASTATIN CALCIUM 20 MG PO TABS
20.0000 mg | ORAL_TABLET | Freq: Every day | ORAL | Status: DC
  Administered 2021-09-09 – 2021-09-10 (×2): 20 mg via ORAL
  Filled 2021-09-09 (×2): qty 1

## 2021-09-09 MED ORDER — HYDRALAZINE HCL 20 MG/ML IJ SOLN
5.0000 mg | INTRAMUSCULAR | Status: DC | PRN
  Administered 2021-09-09 – 2021-09-12 (×5): 5 mg via INTRAVENOUS
  Filled 2021-09-09 (×5): qty 1

## 2021-09-09 MED ORDER — CHLORHEXIDINE GLUCONATE 0.12 % MT SOLN
15.0000 mL | Freq: Two times a day (BID) | OROMUCOSAL | Status: DC
  Administered 2021-09-09 – 2021-09-11 (×5): 15 mL via OROMUCOSAL
  Filled 2021-09-09 (×7): qty 15

## 2021-09-09 MED ORDER — SODIUM CHLORIDE 0.9% FLUSH
3.0000 mL | Freq: Two times a day (BID) | INTRAVENOUS | Status: DC
Start: 2021-09-09 — End: 2021-09-12
  Administered 2021-09-09 – 2021-09-11 (×5): 3 mL via INTRAVENOUS

## 2021-09-09 MED ORDER — GABAPENTIN 300 MG PO CAPS
600.0000 mg | ORAL_CAPSULE | Freq: Two times a day (BID) | ORAL | Status: DC
  Administered 2021-09-09 – 2021-09-12 (×7): 600 mg via ORAL
  Filled 2021-09-09 (×7): qty 2

## 2021-09-09 MED ORDER — METOPROLOL TARTRATE 50 MG PO TABS
100.0000 mg | ORAL_TABLET | Freq: Two times a day (BID) | ORAL | Status: DC
  Administered 2021-09-09 – 2021-09-12 (×7): 100 mg via ORAL
  Filled 2021-09-09 (×2): qty 2
  Filled 2021-09-09: qty 4
  Filled 2021-09-09 (×4): qty 2

## 2021-09-09 MED ORDER — IRBESARTAN 150 MG PO TABS
300.0000 mg | ORAL_TABLET | Freq: Every day | ORAL | Status: DC
  Filled 2021-09-09: qty 2
  Filled 2021-09-09: qty 1

## 2021-09-09 MED ORDER — POLYETHYLENE GLYCOL 3350 17 G PO PACK: 17.0000 g | PACK | Freq: Every day | ORAL | Status: DC | PRN

## 2021-09-09 MED ORDER — POTASSIUM CHLORIDE CRYS ER 20 MEQ PO TBCR
40.0000 meq | EXTENDED_RELEASE_TABLET | Freq: Three times a day (TID) | ORAL | Status: DC
  Administered 2021-09-09 (×3): 40 meq via ORAL
  Filled 2021-09-09: qty 2

## 2021-09-09 MED ORDER — SODIUM CHLORIDE 0.9 % IV BOLUS
1000.0000 mL | Freq: Once | INTRAVENOUS | Status: AC
  Administered 2021-09-09: 1000 mL via INTRAVENOUS

## 2021-09-09 MED ORDER — ENOXAPARIN SODIUM 40 MG/0.4ML IJ SOSY
40.0000 mg | PREFILLED_SYRINGE | INTRAMUSCULAR | Status: DC
  Administered 2021-09-09 – 2021-09-12 (×4): 40 mg via SUBCUTANEOUS
  Filled 2021-09-09 (×4): qty 0.4

## 2021-09-09 MED ORDER — HYDROCODONE-ACETAMINOPHEN 5-325 MG PO TABS: 1.0000 | ORAL_TABLET | ORAL | Status: DC | PRN

## 2021-09-09 MED ORDER — AMOXICILLIN 500 MG PO CAPS
500.0000 mg | ORAL_CAPSULE | Freq: Three times a day (TID) | ORAL | Status: DC
  Administered 2021-09-09 – 2021-09-12 (×10): 500 mg via ORAL
  Filled 2021-09-09 (×10): qty 1

## 2021-09-09 MED ORDER — INSULIN ASPART 100 UNIT/ML IJ SOLN
0.0000 [IU] | Freq: Three times a day (TID) | INTRAMUSCULAR | Status: DC
  Administered 2021-09-09: 3 [IU] via SUBCUTANEOUS
  Administered 2021-09-09: 11 [IU] via SUBCUTANEOUS
  Administered 2021-09-10: 5 [IU] via SUBCUTANEOUS
  Administered 2021-09-11: 8 [IU] via SUBCUTANEOUS
  Administered 2021-09-11: 2 [IU] via SUBCUTANEOUS
  Administered 2021-09-11: 3 [IU] via SUBCUTANEOUS
  Administered 2021-09-12: 2 [IU] via SUBCUTANEOUS

## 2021-09-09 MED ORDER — IRBESARTAN 150 MG PO TABS
300.0000 mg | ORAL_TABLET | Freq: Every day | ORAL | Status: DC
  Administered 2021-09-09 – 2021-09-12 (×4): 300 mg via ORAL
  Filled 2021-09-09: qty 1
  Filled 2021-09-09 (×4): qty 2

## 2021-09-09 MED ORDER — ASPIRIN 81 MG PO TBEC
81.0000 mg | DELAYED_RELEASE_TABLET | Freq: Every day | ORAL | Status: DC
  Administered 2021-09-09 – 2021-09-12 (×4): 81 mg via ORAL
  Filled 2021-09-09 (×4): qty 1

## 2021-09-09 MED ORDER — ONDANSETRON HCL 4 MG PO TABS: 4.0000 mg | ORAL_TABLET | Freq: Four times a day (QID) | ORAL | Status: DC | PRN

## 2021-09-09 MED ORDER — INSULIN GLARGINE-YFGN 100 UNIT/ML ~~LOC~~ SOLN
65.0000 [IU] | Freq: Every day | SUBCUTANEOUS | Status: DC
  Administered 2021-09-10 – 2021-09-12 (×3): 65 [IU] via SUBCUTANEOUS
  Filled 2021-09-09 (×4): qty 0.65

## 2021-09-09 MED ORDER — POTASSIUM CHLORIDE 10 MEQ/100ML IV SOLN
10.0000 meq | INTRAVENOUS | Status: AC
  Administered 2021-09-09 (×6): 10 meq via INTRAVENOUS
  Filled 2021-09-09 (×6): qty 100

## 2021-09-09 MED ORDER — HYDRALAZINE HCL 25 MG PO TABS
50.0000 mg | ORAL_TABLET | Freq: Once | ORAL | Status: AC
  Administered 2021-09-09: 50 mg via ORAL
  Filled 2021-09-09: qty 2

## 2021-09-09 MED ORDER — ONDANSETRON HCL 4 MG/2ML IJ SOLN: 4.0000 mg | Freq: Four times a day (QID) | INTRAMUSCULAR | Status: DC | PRN

## 2021-09-09 MED ORDER — BISACODYL 5 MG PO TBEC: 5.0000 mg | DELAYED_RELEASE_TABLET | Freq: Every day | ORAL | Status: DC | PRN

## 2021-09-09 MED ORDER — SODIUM CHLORIDE 0.9 % IV SOLN: INTRAVENOUS | Status: DC

## 2021-09-09 MED ORDER — CLONIDINE HCL 0.2 MG PO TABS
0.2000 mg | ORAL_TABLET | Freq: Once | ORAL | Status: AC
  Administered 2021-09-09: 0.2 mg via ORAL
  Filled 2021-09-09: qty 1

## 2021-09-09 MED ORDER — INSULIN ASPART 100 UNIT/ML IJ SOLN: 0.0000 [IU] | Freq: Every day | INTRAMUSCULAR | Status: DC

## 2021-09-09 MED ORDER — POTASSIUM CHLORIDE CRYS ER 20 MEQ PO TBCR
40.0000 meq | EXTENDED_RELEASE_TABLET | Freq: Once | ORAL | Status: AC
  Administered 2021-09-09: 40 meq via ORAL
  Filled 2021-09-09: qty 2

## 2021-09-09 MED ORDER — POTASSIUM CHLORIDE IN NACL 20-0.9 MEQ/L-% IV SOLN
INTRAVENOUS | Status: DC
  Filled 2021-09-09: qty 1000

## 2021-09-09 MED ORDER — ACETAMINOPHEN 650 MG RE SUPP: 650.0000 mg | Freq: Four times a day (QID) | RECTAL | Status: DC | PRN

## 2021-09-09 MED ORDER — METOPROLOL TARTRATE 25 MG PO TABS
50.0000 mg | ORAL_TABLET | Freq: Once | ORAL | Status: AC
  Administered 2021-09-09: 50 mg via ORAL
  Filled 2021-09-09: qty 2

## 2021-09-09 MED ORDER — POTASSIUM CHLORIDE IN NACL 40-0.9 MEQ/L-% IV SOLN
INTRAVENOUS | Status: DC
  Filled 2021-09-09 (×7): qty 1000

## 2021-09-09 NOTE — H&P (Signed)
History and Physical    PatientMarland Crane LETRICIA KRINSKY VOZ:[id number] DOB: 04-24-1955 DOA: 09/09/2021 DOS: the patient was seen and examined on 09/09/2021 PCP: Burnis Medin, PA-C  Patient coming from: Home - lives with 66yo mother and her son; NOK:  Brother, Lynnell Catalan, [phone]; Tyneshia, St[place], [phone]   Chief Complaint: Weakness  HPI: Lori Crane is a 66 y.o. female with medical history significant of DM; HTN; and HLD presenting with generalized weakness.  She reports that she had oral surgery Friday.  Prior to that, she wasn't eating and was drinking "bad fluids" - tea, V8, sodas.  Her "system was getting off."  She was having diarrhea and took something to control that (Imodium) with improvement. She was scheduled for her surgery, wasn't taking her pills right but was taking her insulin.  She "was just in a tailspin".  Surgery was Friday with 5 teeth extracted.  Before that, she was having to rock to get up, decreased strength but she was able to get up.  She was no longer able to walk as of last night, too weak.  She sat in a chair for about 3 hours, calling for her son and he couldn't hear her.  She fell out of the chair and hit her head on the telephone.    ER Course:  Carryover, per Dr. Toniann Fail:  66 year old female with history of hypertension and diabetes who had recent tooth extraction has been having poor oral intake and generally weak and has unable to ambulate because of the weakness.  Was brought to the ER.  Labs show severe hypokalemia.  Mild rhabdomyolysis.  ER physician is repeating the potassium levels but thinks it is real value.  And patient also having generalized weakness and mild rhabdomyolysis.     Review of Systems: As mentioned in the history of present illness. All other systems reviewed and are negative. Past Medical History:  Diagnosis Date   Diabetes mellitus without complication (HCC)    Hypercholesterolemia    Hypertension     Past Surgical History:  Procedure Laterality Date   ARTERY BIOPSY Left 06/13/2016   Procedure: LEFT TEMPORAL ARTERY BIOPSY;  Surgeon: Maeola Harman, MD;  Location: [facility] OR;  Service: Vascular;  Laterality: Left;   Social History:  reports that she quit smoking about 10 years ago. Her smoking use included cigarettes. She has a 35.00 pack-year smoking history. She has quit using smokeless tobacco. She reports current alcohol use. She reports that she does not currently use drugs after having used the following drugs: Marijuana.  Allergies  Allergen Reactions   Metformin And Related Other (See Comments)    Severe Yeast infection    Family History  Problem Relation Age of Onset   Hypertension Other     Prior to Admission medications   Medication Sig Start Date End Date Taking? Authorizing Provider  acetaminophen (TYLENOL) 325 MG tablet Take 2 tablets (650 mg total) by mouth every 6 (six) hours as needed for mild pain, fever or headache. 08/08/18   Elgergawy, Leana Roe, MD  cloNIDine (CATAPRES) 0.1 MG tablet Take 0.1 mg by mouth 2 (two) times daily. 06/26/18   [provider]  dexamethasone (DECADRON) 6 MG tablet Take 1 tablet (6 mg total) by mouth 2 (two) times daily with a meal. 08/09/18   Elgergawy, Leana Roe, MD  DULoxetine (CYMBALTA) 30 MG capsule Take 30 mg by mouth daily. 06/11/18   [provider]  empagliflozin (JARDIANCE) 25 MG TABS tablet Take  25 mg by mouth daily.  04/30/16   [provider]  gabapentin (NEURONTIN) 300 MG capsule Take 300 mg by mouth 3 (three) times daily as needed (pain).  04/08/16   [provider]  glimepiride (AMARYL) 4 MG tablet Take 4 mg by mouth 2 (two) times a day. 02/02/18   [provider]  hydrALAZINE (APRESOLINE) 50 MG tablet Take 1 tablet (50 mg total) by mouth every 8 (eight) hours. 06/14/16   Tyrone Nine, MD  HYDROcodone-acetaminophen (NORCO/VICODIN) 5-325 MG tablet Take 1 tablet by mouth every 4 (four)  hours as needed. 02/27/17   Elpidio Anis, PA-C  insulin degludec (TRESIBA) 100 UNIT/ML SOPN FlexTouch Pen Inject 50 Units into the skin daily. 02/02/18   [provider]  methocarbamol (ROBAXIN) 500 MG tablet Take 1 tablet (500 mg total) by mouth 2 (two) times daily. 02/27/17   Elpidio Anis, PA-C  metoprolol (LOPRESSOR) 50 MG tablet Take 1 tablet (50 mg total) by mouth 2 (two) times daily. 06/14/16   Tyrone Nine, MD  pantoprazole (PROTONIX) 40 MG tablet Take 1 tablet (40 mg total) by mouth daily. 08/08/18   Elgergawy, Leana Roe, MD  simvastatin (ZOCOR) 20 MG tablet Take 20 mg by mouth every evening.     [provider]  telmisartan-hydrochlorothiazide (MICARDIS HCT) 80-25 MG tablet Take 1 tablet by mouth daily. Resume on 08/12/2018 08/12/18   Elgergawy, Leana Roe, MD    Physical Exam: Vitals:   09/09/21 0715 09/09/21 0750 09/09/21 0751 09/09/21 1018  BP: (!) 158/72 (!) 141/70  (!) 164/81  Pulse: 76 70  75  Resp:  11    Temp:  97.8 F (36.6 C)    TempSrc:  Oral    SpO2: 97% 94% 94%   Weight:      Height:       General:  Appears calm and comfortable and is in NAD Eyes:  PERRL, EOMI, normal lids, iris ENT:  grossly normal hearing, lips & tongue, mmm; edentulous with healing gums Neck:  no LAD, masses or thyromegaly Cardiovascular:  RRR, no m/r/g. 1+ LE edema.  Respiratory:   CTA bilaterally with no wheezes/rales/rhonchi.  Normal respiratory effort. Abdomen:  soft, NT, ND Skin:  no rash or induration seen on limited exam Musculoskeletal:  grossly normal tone BUE/BLE, good ROM, no bony abnormality Psychiatric:  blunted mood and affect, speech fluent and appropriate, AOx3 Neurologic:  CN 2-12 grossly intact, moves all extremities in coordinated fashion   Radiological Exams on Admission: Independently reviewed - see discussion in A/P where applicable  No results found.  EKG: Independently reviewed.  NSR with rate 77; prolonged QTc 531; nonspecific ST changes with no  evidence of acute ischemia   Labs on Admission: I have personally reviewed the available labs and imaging studies at the time of the admission.  Pertinent labs:    K+ <2 CO2 36 Glucose 253 Albumin 3.2 AST 180/ALT 67 CK 10,677 Normal CBC UA: >500 glucose, large Hgb, 30 protein   Assessment and Plan: Principal Problem:   Hypokalemia Active Problems:   Uncontrolled hypertension   Uncontrolled type 2 diabetes mellitus with hyperglycemia, with long-term current use of insulin (HCC)   Rhabdomyolysis   Generalized weakness   Dyslipidemia    Severe hypokalemia -Patient with decreased PO intake prior to dental extraction and worse after -Not significantly dehydrated on exam/labs but with marked hypokalemia (<2) -Repleted with IV + 40 mEq PO -Rechecked BMP and still <2 -Will observe in progressive care  for now -Will add 40 mEq PO TID and 40 mEq KCl to IVF (running at 125 cc/hr) -Recheck BMP at 1800 and in AM  Rhabdomyolysis -CK 10.677 -Likely associated with severe weakness from hypokalemia, difficulty getting out of chair/floor -Will observe for now with aggressive IVF at 125 cc/hour; if not significant improved tomorrow, will likely need to transition to inpatient status for ongoing hydration -Also with mild LFT elevation -Hepatic function panel, CK, and BMP again in AM -Lovenox for DVT prophylaxis -Push PO fluids if able -Strict I/Os -Vital signs q4h -Hepatic and renal injuries are usually reversible with aggressive rehydration but will need ongoing monitoring -PT/OT consults  Generalized weakness, dental extractions -As above, likely related to severe hypokalemia and rhabdo -Exacerbated by recent dental extractions; soft diet needed -PT/OT, nutrition, TOC team consults -Continue Amoxil to complete course -Continue Peridex -Continue Neurontin  DM -A1c is 9.8, indicative of poor control -hold Jardiance, Amaryl, pioglitazone (has not started, reports she is  supposed to take it once a week) -Continue glargine -Cover with moderate-scale SSI   HTN -She is on 2 beta-blockers; will hold Coreg and continue metoprolol -Continue telmisartan  HLD -Continue Crestor   Advance Care Planning:   Code Status: Full Code   Consults: PT/OT; nutrition; TOC team  DVT Prophylaxis: Lovenox  Family Communication: None present; she was not aware of telephone numbers to reach family at the time of admission  Severity of Illness: The appropriate patient status for this patient is OBSERVATION. Observation status is judged to be reasonable and necessary in order to provide the required intensity of service to ensure the patient's safety. The patient's presenting symptoms, physical exam findings, and initial radiographic and laboratory data in the context of their medical condition is felt to place them at decreased risk for further clinical deterioration. Furthermore, it is anticipated that the patient will be medically stable for discharge from the hospital within 2 midnights of admission.   Author: Jonah Blue, MD 09/09/2021 10:52 AM  For on call review [email or web address].

## 2021-09-09 NOTE — Progress Notes (Signed)
       CROSS COVER NOTE  NAME: OLUKEMI PANCHAL MRN: [id number] DOB : [birth year]    Date of Service   09/09/21  HPI/Events of Note   Notified of critical K <2.0. M(r)s Slavick is currently receiving 40 mEQ K PO TID and continuous IVF with of K at /hr.  Interventions   Plan: 40 mEQ IV K      This document was prepared using Dragon voice recognition software and may include unintentional dictation errors.  Bishop Limbo DNP, MHA, FNP-BC Nurse Practitioner [facility] Pager [phone]

## 2021-09-09 NOTE — Progress Notes (Signed)
Pt admitted to 4N-14 from ED with 1 bag of belongings. Pt denies pain at this time. Assessment and VS documented. Pt oriented to unit. CHG completed. Call bell within reach bed alarm on.

## 2021-09-09 NOTE — ED Provider Notes (Signed)
[facility] EMERGENCY DEPARTMENT Provider Note   CSN: [id number] Arrival date & time: 09/09/21  0306     History  Chief Complaint  Patient presents with   Weakness    Lori Crane is a 66 y.o. female.  Patient presents to the emergency department for evaluation of generalized weakness.  Patient reports that she had dental surgery several days ago and has not been able to eat or drink because of it.  She has progressively become more more weak.  She reports that initially she had to really struggle to get up out of a chair, now her arms and legs are too weak to even get up or stand on her own.       Home Medications Prior to Admission medications   Medication Sig Start Date End Date Taking? Authorizing Provider  acetaminophen (TYLENOL) 325 MG tablet Take 2 tablets (650 mg total) by mouth every 6 (six) hours as needed for mild pain, fever or headache. 08/08/18   Elgergawy, Leana Roe, MD  cloNIDine (CATAPRES) 0.1 MG tablet Take 0.1 mg by mouth 2 (two) times daily. 06/26/18   [provider]  dexamethasone (DECADRON) 6 MG tablet Take 1 tablet (6 mg total) by mouth 2 (two) times daily with a meal. 08/09/18   Elgergawy, Leana Roe, MD  DULoxetine (CYMBALTA) 30 MG capsule Take 30 mg by mouth daily. 06/11/18   [provider]  empagliflozin (JARDIANCE) 25 MG TABS tablet Take 25 mg by mouth daily.  04/30/16   [provider]  gabapentin (NEURONTIN) 300 MG capsule Take 300 mg by mouth 3 (three) times daily as needed (pain).  04/08/16   [provider]  glimepiride (AMARYL) 4 MG tablet Take 4 mg by mouth 2 (two) times a day. 02/02/18   [provider]  hydrALAZINE (APRESOLINE) 50 MG tablet Take 1 tablet (50 mg total) by mouth every 8 (eight) hours. 06/14/16   Tyrone Nine, MD  HYDROcodone-acetaminophen (NORCO/VICODIN) 5-325 MG tablet Take 1 tablet by mouth every 4 (four) hours as needed. 02/27/17   Elpidio Anis, PA-C  insulin degludec  (TRESIBA) 100 UNIT/ML SOPN FlexTouch Pen Inject 50 Units into the skin daily. 02/02/18   [provider]  methocarbamol (ROBAXIN) 500 MG tablet Take 1 tablet (500 mg total) by mouth 2 (two) times daily. 02/27/17   Elpidio Anis, PA-C  metoprolol (LOPRESSOR) 50 MG tablet Take 1 tablet (50 mg total) by mouth 2 (two) times daily. 06/14/16   Tyrone Nine, MD  pantoprazole (PROTONIX) 40 MG tablet Take 1 tablet (40 mg total) by mouth daily. 08/08/18   Elgergawy, Leana Roe, MD  simvastatin (ZOCOR) 20 MG tablet Take 20 mg by mouth every evening.     [provider]  telmisartan-hydrochlorothiazide (MICARDIS HCT) 80-25 MG tablet Take 1 tablet by mouth daily. Resume on 08/12/2018 08/12/18   Elgergawy, Leana Roe, MD      Allergies    Metformin and related    Review of Systems   Review of Systems  Physical Exam Updated Vital Signs BP (!) 191/82   Pulse 77   Ht 5\' 6"  (1.676 m)   Wt 77.9 kg   SpO2 96%   BMI 27.72 kg/m  Physical Exam Vitals and nursing note reviewed.  Constitutional:      General: She is not in acute distress.    Appearance: She is well-developed.  HENT:     Head: Normocephalic and atraumatic.     Mouth/Throat:  Mouth: Mucous membranes are moist.  Eyes:     General: Vision grossly intact. Gaze aligned appropriately.     Extraocular Movements: Extraocular movements intact.     Conjunctiva/sclera: Conjunctivae normal.  Cardiovascular:     Rate and Rhythm: Normal rate and regular rhythm.     Pulses: Normal pulses.     Heart sounds: Normal heart sounds, S1 normal and S2 normal. No murmur heard.    No friction rub. No gallop.  Pulmonary:     Effort: Pulmonary effort is normal. No respiratory distress.     Breath sounds: Normal breath sounds.  Abdominal:     General: Bowel sounds are normal.     Palpations: Abdomen is soft.     Tenderness: There is no abdominal tenderness. There is no guarding or rebound.     Hernia: No hernia is present.  Musculoskeletal:         General: No swelling.     Cervical back: Full passive range of motion without pain, normal range of motion and neck supple. No spinous process tenderness or muscular tenderness. Normal range of motion.     Right lower leg: No edema.     Left lower leg: No edema.  Skin:    General: Skin is warm and dry.     Capillary Refill: Capillary refill takes less than 2 seconds.     Findings: No ecchymosis, erythema, rash or wound.  Neurological:     General: No focal deficit present.     Mental Status: She is alert and oriented to person, place, and time.     GCS: GCS eye subscore is 4. GCS verbal subscore is 5. GCS motor subscore is 6.     Cranial Nerves: Cranial nerves 2-12 are intact.     Sensory: Sensation is intact.     Motor: Weakness (Generalized, symmetric all 4 extremities) present.     Coordination: Coordination is intact.  Psychiatric:        Attention and Perception: Attention normal.        Mood and Affect: Mood normal.        Speech: Speech normal.        Behavior: Behavior normal.     ED Results / Procedures / Treatments   Labs (all labs ordered are listed, but only abnormal results are displayed) Labs Reviewed  COMPREHENSIVE METABOLIC PANEL - Abnormal; Notable for the following components:      Result Value   Potassium <2.0 (*)    Chloride 97 (*)    CO2 36 (*)    Glucose, Bld 253 (*)    BUN <5 (*)    Albumin 3.2 (*)    AST 180 (*)    ALT 67 (*)    All other components within normal limits  CK - Abnormal; Notable for the following components:   Total CK 10,677 (*)    All other components within normal limits  MAGNESIUM - Abnormal; Notable for the following components:   Magnesium 2.6 (*)    All other components within normal limits  CBG MONITORING, ED - Abnormal; Notable for the following components:   Glucose-Capillary 225 (*)    All other components within normal limits  CBC WITH DIFFERENTIAL/PLATELET  URINALYSIS, ROUTINE W REFLEX MICROSCOPIC  I-STAT CHEM  8, ED    EKG EKG Interpretation  Date/Time:  Sunday September 09 2021 03:11:11 EDT Ventricular Rate:  77 PR Interval:  209 QRS Duration: 102 QT Interval:  469 QTC Calculation: 531 R Axis:  34 Text Interpretation: Sinus rhythm Nonspecific T abnormalities, anterior leads Prolonged QT interval Confirmed by Gilda Crease [phone]) on 09/09/2021 3:23:06 AM  Radiology No results found.  Procedures Procedures    Medications Ordered in ED Medications  sodium chloride 0.9 % bolus 1,000 mL (1,000 mLs Intravenous New Bag/Given 09/09/21 0404)    ED Course/ Medical Decision Making/ A&P                           Medical Decision Making Amount and/or Complexity of Data Reviewed External Data Reviewed: labs. Labs: ordered. ECG/medicine tests: ordered and independent interpretation performed. Decision-making details documented in ED Course.  Risk Decision regarding hospitalization.  Presents to the emergency department with generalized weakness in the setting of recent dental extractions causing her to be unable to eat, drink or take her medications.  Patient thought to be dehydrated secondary to poor intake.  Administered IV fluids.  Lab work reveals profound hypokalemia.  She does take hydrochlorothiazide, has not had hypokalemia in the past.  Magnesium is not completed.  Initiate oral and IV potassium replacement.  Patient's generalized weakness is likely secondary at least in part to hypokalemia.  She does, however, take a statin and her CPK is elevated above 10,000.  No evidence of renal failure.         Final Clinical Impression(s) / ED Diagnoses Final diagnoses:  Generalized weakness  Hypokalemia  Non-traumatic rhabdomyolysis    Rx / DC Orders ED Discharge Orders     None         Gilda Crease, MD 09/09/21 [phone]

## 2021-09-09 NOTE — Progress Notes (Signed)
Pt BP 185/87 administered PRN hydralazine BP 143/96.   Noted on MAR pt irbesartan documented not given d/t med not available in ED. Called pharmacy to clarify if due time can be changed so the medication can be given now. Pharmacist given verbal to edit documentation and change due time and administer irbesartan.

## 2021-09-09 NOTE — Progress Notes (Signed)
Date and time results received: 09/09/21 1858   Test: Potassium Critical Value: < 2.0  Name of Provider Notified: text paged [facility] on call.   Orders Received? Or Actions Taken?:  Awaiting response.

## 2021-09-09 NOTE — ED Notes (Signed)
Critical potassium <2.0 and BUN <5.  MD Yates aware.  No new orders at this time.

## 2021-09-09 NOTE — ED Notes (Signed)
CRITICAL VALUE STICKER  CRITICAL VALUE:k less than 2.0  RECEIVER (on-site recipient of call):Olanrewaju Osborn  DATE & TIME NOTIFIED: 09/09/21 0538  MESSENGER (representative from lab):eniola  MD NOTIFIED: polina  TIME OF NOTIFICATION:0538  RESPONSE: mg

## 2021-09-09 NOTE — ED Triage Notes (Signed)
Pt had dental surgery on Friday, and has not been able to eat, drink or take daily meds. Pt went to get out of bed this evening and both legs gave out and she fell to the ground. Denies LOC, hitting head. Only meds pt has taken are amoxicillin and pain medication. Pt also c/o SOB. Denies CP. PT endorses numbness and tingling in both feet

## 2021-09-10 DIAGNOSIS — I10 Essential (primary) hypertension: Secondary | ICD-10-CM

## 2021-09-10 DIAGNOSIS — Z794 Long term (current) use of insulin: Secondary | ICD-10-CM | POA: Diagnosis not present

## 2021-09-10 DIAGNOSIS — E870 Hyperosmolality and hypernatremia: Secondary | ICD-10-CM | POA: Diagnosis present

## 2021-09-10 DIAGNOSIS — E876 Hypokalemia: Principal | ICD-10-CM

## 2021-09-10 DIAGNOSIS — Z8249 Family history of ischemic heart disease and other diseases of the circulatory system: Secondary | ICD-10-CM | POA: Diagnosis not present

## 2021-09-10 DIAGNOSIS — E1165 Type 2 diabetes mellitus with hyperglycemia: Secondary | ICD-10-CM

## 2021-09-10 DIAGNOSIS — R7989 Other specified abnormal findings of blood chemistry: Secondary | ICD-10-CM | POA: Diagnosis present

## 2021-09-10 DIAGNOSIS — K047 Periapical abscess without sinus: Secondary | ICD-10-CM | POA: Diagnosis present

## 2021-09-10 DIAGNOSIS — Z7984 Long term (current) use of oral hypoglycemic drugs: Secondary | ICD-10-CM | POA: Diagnosis not present

## 2021-09-10 DIAGNOSIS — E785 Hyperlipidemia, unspecified: Secondary | ICD-10-CM | POA: Diagnosis not present

## 2021-09-10 DIAGNOSIS — T796XXD Traumatic ischemia of muscle, subsequent encounter: Secondary | ICD-10-CM

## 2021-09-10 DIAGNOSIS — E7800 Pure hypercholesterolemia, unspecified: Secondary | ICD-10-CM | POA: Diagnosis present

## 2021-09-10 DIAGNOSIS — Z87891 Personal history of nicotine dependence: Secondary | ICD-10-CM | POA: Diagnosis not present

## 2021-09-10 DIAGNOSIS — D649 Anemia, unspecified: Secondary | ICD-10-CM | POA: Diagnosis present

## 2021-09-10 DIAGNOSIS — M6282 Rhabdomyolysis: Secondary | ICD-10-CM | POA: Diagnosis present

## 2021-09-10 DIAGNOSIS — Z888 Allergy status to other drugs, medicaments and biological substances status: Secondary | ICD-10-CM | POA: Diagnosis not present

## 2021-09-10 DIAGNOSIS — R531 Weakness: Secondary | ICD-10-CM | POA: Diagnosis not present

## 2021-09-10 DIAGNOSIS — Z79899 Other long term (current) drug therapy: Secondary | ICD-10-CM | POA: Diagnosis not present

## 2021-09-10 DIAGNOSIS — E1140 Type 2 diabetes mellitus with diabetic neuropathy, unspecified: Secondary | ICD-10-CM | POA: Diagnosis present

## 2021-09-10 LAB — CBC
HCT: 31.6 % — ABNORMAL LOW (ref 36.0–46.0)
Hemoglobin: 10.3 g/dL — ABNORMAL LOW (ref 12.0–15.0)
MCH: 27.1 pg (ref 26.0–34.0)
MCHC: 32.6 g/dL (ref 30.0–36.0)
MCV: 83.2 fL (ref 80.0–100.0)
Platelets: 173 10*3/uL (ref 150–400)
RBC: 3.8 MIL/uL — ABNORMAL LOW (ref 3.87–5.11)
RDW: 15 % (ref 11.5–15.5)
WBC: 5.5 10*3/uL (ref 4.0–10.5)
nRBC: 0 % (ref 0.0–0.2)

## 2021-09-10 LAB — COMPREHENSIVE METABOLIC PANEL
ALT: 47 U/L — ABNORMAL HIGH (ref 0–44)
AST: 117 U/L — ABNORMAL HIGH (ref 15–41)
Albumin: 2.4 g/dL — ABNORMAL LOW (ref 3.5–5.0)
Alkaline Phosphatase: 53 U/L (ref 38–126)
Anion gap: 6 (ref 5–15)
BUN: <5 mg/dL — ABNORMAL LOW (ref 8–23)
CO2: 32 mmol/L (ref 22–32)
Calcium: 7.7 mg/dL — ABNORMAL LOW (ref 8.9–10.3)
Chloride: 107 mmol/L (ref 98–111)
Creatinine, Ser: 0.67 mg/dL (ref 0.44–1.00)
GFR, Estimated: >60 mL/min (ref >60)
Glucose, Bld: 143 mg/dL — ABNORMAL HIGH (ref 70–99)
Potassium: 2.4 mmol/L — CL (ref 3.5–5.1)
Sodium: 145 mmol/L (ref 135–145)
Total Bilirubin: 0.7 mg/dL (ref 0.3–1.2)
Total Protein: 5.4 g/dL — ABNORMAL LOW (ref 6.5–8.1)

## 2021-09-10 LAB — BASIC METABOLIC PANEL
Anion gap: 6 (ref 5–15)
BUN: <5 mg/dL — ABNORMAL LOW (ref 8–23)
CO2: 32 mmol/L (ref 22–32)
Calcium: 8.1 mg/dL — ABNORMAL LOW (ref 8.9–10.3)
Chloride: 105 mmol/L (ref 98–111)
Creatinine, Ser: 0.64 mg/dL (ref 0.44–1.00)
GFR, Estimated: >60 mL/min (ref >60)
Glucose, Bld: 109 mg/dL — ABNORMAL HIGH (ref 70–99)
Potassium: 2.7 mmol/L — CL (ref 3.5–5.1)
Sodium: 143 mmol/L (ref 135–145)

## 2021-09-10 LAB — GLUCOSE, CAPILLARY
Glucose-Capillary: 115 mg/dL — ABNORMAL HIGH (ref 70–99)
Glucose-Capillary: 259 mg/dL — ABNORMAL HIGH (ref 70–99)
Glucose-Capillary: 114 mg/dL — ABNORMAL HIGH (ref 70–99)
Glucose-Capillary: 100 mg/dL — ABNORMAL HIGH (ref 70–99)

## 2021-09-10 LAB — CK
Total CK: 5983 U/L — ABNORMAL HIGH (ref 38–234)
Total CK: 9089 U/L — ABNORMAL HIGH (ref 38–234)

## 2021-09-10 MED ORDER — POTASSIUM CHLORIDE CRYS ER 20 MEQ PO TBCR
40.0000 meq | EXTENDED_RELEASE_TABLET | Freq: Two times a day (BID) | ORAL | Status: DC
  Administered 2021-09-10 – 2021-09-11 (×4): 40 meq via ORAL
  Filled 2021-09-10 (×3): qty 2

## 2021-09-10 MED ORDER — POTASSIUM CHLORIDE 10 MEQ/100ML IV SOLN
10.0000 meq | INTRAVENOUS | Status: AC
  Administered 2021-09-10 (×5): 10 meq via INTRAVENOUS
  Filled 2021-09-10 (×5): qty 100

## 2021-09-10 MED ORDER — LIVING WELL WITH DIABETES BOOK
Freq: Once | Status: AC
Start: 2021-09-10 — End: 2021-09-10
  Filled 2021-09-10: qty 1

## 2021-09-10 MED ORDER — POTASSIUM CHLORIDE CRYS ER 20 MEQ PO TBCR
40.0000 meq | EXTENDED_RELEASE_TABLET | Freq: Once | ORAL | Status: AC
Start: 2021-09-10 — End: 2021-09-10
  Administered 2021-09-10: 40 meq via ORAL
  Filled 2021-09-10: qty 2

## 2021-09-10 NOTE — Progress Notes (Signed)
PROGRESS NOTE    Lori Crane  DQQ:[id number] DOB: 1955/05/09 DOA: 09/09/2021 PCP: Burnis Medin, PA-C   Brief Narrative:  Lori Crane is a 66 y.o. female with medical history significant of DM; HTN; and HLD presenting with generalized weakness.  She reports that she had oral surgery Friday.  Prior to that, she wasn't eating and was drinking "bad fluids" - tea, V8, sodas.  Her "system was getting off."  She was having diarrhea and took something to control that (Imodium) with improvement. She was scheduled for her surgery, wasn't taking her pills right but was taking her insulin.  She "was just in a tailspin".  Surgery was Friday with 5 teeth extracted.  Before that, she was having to rock to get up, decreased strength but she was able to get up.  She was no longer able to walk as of last night, too weak.  She sat in a chair for about 3 hours, calling for her son and he couldn't hear her.  She fell out of the chair and hit her head on the telephone.   Assessment & Plan:   Principal Problem:   Hypokalemia Active Problems:   Uncontrolled hypertension   Uncontrolled type 2 diabetes mellitus with hyperglycemia, with long-term current use of insulin (HCC)   Rhabdomyolysis   Generalized weakness   Dyslipidemia   Severe hypokalemia -Patient with decreased PO intake prior to dental extraction and worse after -Not significantly dehydrated on exam/labs but with marked hypokalemia (<2) -Repleting with IV and PO -Rechecked BMP 1500   Rhabdomyolysis -CK 10.600 > 5800 -Likely associated with severe weakness from hypokalemia, difficulty getting out of chair/floor Ck bid -C/W agg hydration -Also with mild LFT elevation -Lovenox for DVT prophylaxis -renal fx intact -PT/OT consults   Generalized weakness, dental extractions -As above, likely related to severe hypokalemia and rhabdo -Exacerbated by recent dental extractions; soft diet needed -PT/OT, nutrition, TOC team  consults -Continue Amoxil to complete course -Continue Peridex -Continue Neurontin   DM -A1c is 9.8, indicative of poor control -hold Jardiance, Amaryl, pioglitazone (has not started, reports she is supposed to take it once a week) -Continue glargine -Cover with moderate-scale SSI    HTN -She is on 2 beta-blockers; will hold Coreg and continue metoprolol -Continue telmisartan   HLD -Continue Crestor        Consults: PT/OT; nutrition; TOC team   DVT Prophylaxis: Lovenox Code Status: full    Code Status Orders  (From admission, onward)           Start     Ordered   09/09/21 0848  Full code  Continuous        09/09/21 0850           Code Status History     Date Active Date Inactive Code Status Order ID Comments User Context   06/11/2016 0438 06/14/2016 1534 Full Code [id number]  Eduard Clos, MD ED      Family Communication: discussed in detail with patient at bedside  Disposition Plan:   requires iv hydration, not ready for d/c Consults called: None Admission status: Inpatient   Consultants:  None  Procedures:  No results found.     Subjective: Pt reports she is starting to feel stronger, worked with PT today  Objective: Vitals:   09/10/21 0403 09/10/21 0745 09/10/21 1215 09/10/21 1221  BP: (!) 155/79 (!) 172/83 (!) 184/102 (!) 184/102  Pulse: 70  75   Resp: 14  18  Temp: 98 F (36.7 C) 98 F (36.7 C) 98.2 F (36.8 C)   TempSrc: Oral Oral Oral   SpO2: 96%     Weight:      Height:        Intake/Output Summary (Last 24 hours) at 09/10/2021 1353 Last data filed at 09/10/2021 0900 Gross per 24 hour  Intake 1232.65 ml  Output 1900 ml  Net -667.35 ml   Filed Weights   09/09/21 0310  Weight: 77.9 kg    Examination:  General exam: Appears calm and comfortable  Respiratory system: Clear to auscultation. Respiratory effort normal. Cardiovascular system: S1 & S2 heard, RRR. No JVD, murmurs, rubs, gallops or clicks. No pedal  edema. Gastrointestinal system: Abdomen is nondistended, soft and nontender. No organomegaly or masses felt. Normal bowel sounds heard. Central nervous system: Alert and oriented. No focal neurological deficits. Extremities: Symmetric 5 x 5 power. Skin: No rashes, lesions or ulcers Psychiatry: Judgement and insight appear normal. Mood & affect appropriate.     Data Reviewed: I have personally reviewed following labs and imaging studies  CBC: Recent Labs  Lab 09/09/21 0348 09/10/21 0206  WBC 7.2 5.5  NEUTROABS 5.3  --   HGB 12.9 10.3*  HCT 39.8 31.6*  MCV 83.3 83.2  PLT 216 173   Basic Metabolic Panel: Recent Labs  Lab 09/09/21 0348 09/09/21 0927 09/09/21 1743 09/10/21 0206  NA 145 146* 144 145  K <2.0* <2.0* <2.0* 2.4*  CL 97* 101 104 107  CO2 36* 37* 34* 32  GLUCOSE 253* 253* 155* 143*  BUN <5* <5* <5* <5*  CREATININE 0.91 0.82 0.66 0.67  CALCIUM 9.3 8.4* 8.0* 7.7*  MG 2.6*  --   --   --    GFR: Estimated Creatinine Clearance: 72.8 mL/min (by C-G formula based on SCr of 0.67 mg/dL). Liver Function Tests: Recent Labs  Lab 09/09/21 0348 09/10/21 0206  AST 180* 117*  ALT 67* 47*  ALKPHOS 78 53  BILITOT 0.9 0.7  PROT 7.4 5.4*  ALBUMIN 3.2* 2.4*   No results for input(s): "LIPASE", "AMYLASE" in the last 168 hours. No results for input(s): "AMMONIA" in the last 168 hours. Coagulation Profile: No results for input(s): "INR", "PROTIME" in the last 168 hours. Cardiac Enzymes: Recent Labs  Lab 09/09/21 0348 09/10/21 0206  CKTOTAL 10,677* 5,983*   BNP (last 3 results) No results for input(s): "PROBNP" in the last 8760 hours. HbA1C: Recent Labs    09/09/21 0927  HGBA1C 9.8*   CBG: Recent Labs  Lab 09/09/21 1139 09/09/21 1622 09/09/21 2121 09/10/21 0723 09/10/21 1143  GLUCAP 336* 176* 116* 115* 259*   Lipid Profile: No results for input(s): "CHOL", "HDL", "LDLCALC", "TRIG", "CHOLHDL", "LDLDIRECT" in the last 72 hours. Thyroid Function  Tests: No results for input(s): "TSH", "T4TOTAL", "FREET4", "T3FREE", "THYROIDAB" in the last 72 hours. Anemia Panel: No results for input(s): "VITAMINB12", "FOLATE", "FERRITIN", "TIBC", "IRON", "RETICCTPCT" in the last 72 hours. Sepsis Labs: No results for input(s): "PROCALCITON", "LATICACIDVEN" in the last 168 hours.  Recent Results (from the past 240 hour(s))  MRSA Next Gen by PCR, Nasal     Status: None   Collection Time: 09/09/21  2:33 PM   Specimen: Nasal Mucosa; Nasal Swab  Result Value Ref Range Status   MRSA by PCR Next Gen NOT DETECTED NOT DETECTED Final    Comment: (NOTE) The GeneXpert MRSA Assay (FDA approved for NASAL specimens only), is one component of a comprehensive MRSA colonization surveillance program. It is not  intended to diagnose MRSA infection nor to guide or monitor treatment for MRSA infections. Test performance is not FDA approved in patients less than 86 years old. Performed at [facility], [place] [address] [place], [place] [address]          Radiology Studies: No results found.      Scheduled Meds:  amoxicillin  500 mg Oral TID   aspirin EC  81 mg Oral Daily   chlorhexidine  15 mL Mouth/Throat BID   enoxaparin (LOVENOX) injection  40 mg Subcutaneous Q24H   gabapentin  600 mg Oral BID   insulin aspart  0-15 Units Subcutaneous TID WC   insulin aspart  0-5 Units Subcutaneous QHS   insulin glargine-yfgn  65 Units Subcutaneous Daily   irbesartan  300 mg Oral Daily   metoprolol tartrate  100 mg Oral BID   potassium chloride  40 mEq Oral BID   sodium chloride flush  3 mL Intravenous Q12H   Continuous Infusions:  0.9 % NaCl with KCl 40 mEq / L 125 mL/hr at 09/10/21 0959     LOS: 0 days    Time spent: 32  min    Nicolette Bang, MD [facility]  If 7PM-7AM, please contact night-coverage  09/10/2021, 1:53 PM

## 2021-09-10 NOTE — Plan of Care (Signed)
  RD consulted for nutrition education regarding diabetes.  Pt reports that she drinks mostly Dr Reino Kent and sweet tea. She states that she is "addicted". We discussed different drink options to try so that she can avoid sugar sweetened beverages. Pt agreeable to trying Dr Marisue Ivan.   Lab Results  Component Value Date   HGBA1C 9.8 (H) 09/09/2021    RD provided "Carbohydrate Counting for People with Diabetes" handout from the Academy of Nutrition and Dietetics. Discussed different food groups and their effects on blood sugar, emphasizing carbohydrate-containing foods. Provided list of carbohydrates and recommended serving sizes of common foods.  Discussed importance of controlled and consistent carbohydrate intake throughout the day. Provided examples of ways to balance meals/snacks and encouraged intake of high-fiber, whole grain complex carbohydrates. Teach back method used.  Expect fair compliance.  Body mass index is 27.72 kg/m.   Current diet order is Soft, patient is consuming approximately 100% of meals at this time. Labs and medications reviewed. No further nutrition interventions warranted at this time. RD contact information provided. If additional nutrition issues arise, please re-consult RD.  Cammy Copa., RD, LDN, CNSC See AMiON for contact information

## 2021-09-10 NOTE — Evaluation (Signed)
Physical Therapy Evaluation Patient Details Name: Lori Crane MRN: [id number] DOB: [birth year] Today's Date: 09/10/2021  History of Present Illness  Patient is a 66 y/o female who presents with weakness s/p fall at home, complaining of numbness/tingling in bil feet. Admitted with severe hypokalemia and mild rhabdomyolosis. Recent dental surgery on 8/4 with 5 teeth extractions. PMH includes DM and HTN.  Clinical Impression  Patient presents with generalized weakness, decreased activity tolerance, impaired balance and impaired mobility s/p above. Pt lives at home with her son and 33 y/o mother and reports being independent for ADLs/IADLs at baseline. Today, pt requires Min guard assist for all mobility, using RW for longer distance walking for extra support and due to first time being up. Sp02 87% on RA upon arrival. Dropped to 76% on RA during walking, donned 2L and able to maintain in high 80s-90s with walking. Placed on 1L at rest post session. Pt highly motivated to return to PLOF. Will recommend mobility tech to work with pt while in the hospital. Likely will progress well with increased activity. Will follow acutely to maximize independence and mobility prior to return home.     Recommendations for follow up therapy are one component of a multi-disciplinary discharge planning process, led by the attending physician.  Recommendations may be updated based on patient status, additional functional criteria and insurance authorization.  Follow Up Recommendations No PT follow up      Assistance Recommended at Discharge PRN  Patient can return home with the following  A little help with bathing/dressing/bathroom;A little help with walking and/or transfers;Assistance with cooking/housework;Assist for transportation;Help with stairs or ramp for entrance    Equipment Recommendations Rolling walker (2 wheels) (pending improvement)  Recommendations for Other Services       Functional Status  Assessment Patient has had a recent decline in their functional status and demonstrates the ability to make significant improvements in function in a reasonable and predictable amount of time.     Precautions / Restrictions Precautions Precautions: Fall;Other (comment) Precaution Comments: watch 02 Restrictions Weight Bearing Restrictions: No      Mobility  Bed Mobility Overal bed mobility: Needs Assistance Bed Mobility: Rolling, Sidelying to Sit Rolling: Supervision Sidelying to sit: Supervision, HOB elevated       General bed mobility comments: Use of rail, no physical assist needed.    Transfers Overall transfer level: Needs assistance Equipment used: Rolling walker (2 wheels) Transfers: Sit to/from Stand Sit to Stand: Min guard           General transfer comment: Min guard for safety. Stood from [employer], from toilet x1, transferred to [employer] post ambulation. Cues for hand placement/technique.    Ambulation/Gait Ambulation/Gait assistance: Min guard, Min assist Gait Distance (Feet): 200 Feet Assistive device: Rolling walker (2 wheels), None Gait Pattern/deviations: Step-through pattern, Decreased stride length, Drifts right/left Gait velocity: decreased Gait velocity interpretation: 1.31 - 2.62 ft/sec, indicative of limited community ambulator   General Gait Details: Slow, mildly unsteady gait without use of DME, needing Min A progressing to min guard assist in hallway using RW for support. A few standing rest breaks and cues for pursed lip breathing. Sp02 dropped to 76% on RA, likely good reading, donned 2L during walking.  Stairs            Wheelchair Mobility    Modified Rankin (Stroke Patients Only)       Balance Overall balance assessment: Needs assistance Sitting-balance support: Feet supported, No upper extremity supported Sitting balance-Leahy Scale:  Good     Standing balance support: During functional activity Standing balance-Leahy Scale:  Fair Standing balance comment: Able to stand at sink and wash hands, doesd better wtih UE suppor for walking and for energy conservation.                             Pertinent Vitals/Pain Pain Assessment Pain Assessment: No/denies pain    Home Living Family/patient expects to be discharged to:: Private residence Living Arrangements: Children;Parent (19 y/o mother) Available Help at Discharge: Family;Available PRN/intermittently Type of Home: House Home Access: Stairs to enter Entrance Stairs-Rails: Right Entrance Stairs-Number of Steps: 3   Home Layout: One level Home Equipment: Tub bench;BSC/3in1;Rolling Environmental consultant (2 wheels) Additional Comments: mom's DME    Prior Function Prior Level of Function : Independent/Modified Independent             Mobility Comments: Drives, cooks/cleans, cares for mother,w ears 02 at night only ADLs Comments: independent     Hand Dominance        Extremity/Trunk Assessment   Upper Extremity Assessment Upper Extremity Assessment: Defer to OT evaluation    Lower Extremity Assessment Lower Extremity Assessment: Generalized weakness (but functional)    Cervical / Trunk Assessment Cervical / Trunk Assessment: Normal  Communication   Communication: No difficulties  Cognition Arousal/Alertness: Awake/alert Behavior During Therapy: WFL for tasks assessed/performed Overall Cognitive Status: Within Functional Limits for tasks assessed                                          General Comments General comments (skin integrity, edema, etc.): Sp02 87% on RA upon arrival. Dropped to 76% on RA during walking, donned 2L and able to maintain in high 80s-90s with walking. placed on 1L at rest.    Exercises     Assessment/Plan    PT Assessment Patient needs continued PT services  PT Problem List Decreased strength;Decreased mobility;Cardiopulmonary status limiting activity;Decreased activity tolerance;Decreased  balance       PT Treatment Interventions Therapeutic activities;Gait training;Stair training;Balance training;Functional mobility training;Patient/family education;Therapeutic exercise;DME instruction    PT Goals (Current goals can be found in the Care Plan section)  Acute Rehab PT Goals Patient Stated Goal: to return to PLOF and go home PT Goal Formulation: With patient Time For Goal Achievement: 09/24/21 Potential to Achieve Goals: Good    Frequency Min 3X/week     Co-evaluation               AM-PAC PT "6 Clicks" Mobility  Outcome Measure Help needed turning from your back to your side while in a flat bed without using bedrails?: A Little Help needed moving from lying on your back to sitting on the side of a flat bed without using bedrails?: A Little Help needed moving to and from a bed to a chair (including a wheelchair)?: A Little Help needed standing up from a chair using your arms (e.g., wheelchair or bedside chair)?: A Little Help needed to walk in hospital room?: A Little Help needed climbing 3-5 steps with a railing? : A Little 6 Click Score: 18    End of Session Equipment Utilized During Treatment: Gait belt;Oxygen Activity Tolerance: Treatment limited secondary to medical complications (Comment) (drop in Sp02) Patient left: in chair;with call bell/phone within reach;with chair alarm set Nurse Communication: Mobility status;Other (comment) (02 needs) PT  Visit Diagnosis: Difficulty in walking, not elsewhere classified (R26.2);Muscle weakness (generalized) (M62.81)    Time: 3536-1443 PT Time Calculation (min) (ACUTE ONLY): 34 min   Charges:   PT Evaluation $PT Eval Moderate Complexity: 1 Mod          Vale Haven, PT, DPT Acute Rehabilitation Services Secure chat preferred Office [phone]     Blake Divine A Josip Merolla 09/10/2021, 9:57 AM

## 2021-09-10 NOTE — Evaluation (Signed)
Occupational Therapy Evaluation Patient Details Name: Lori Crane MRN: [id number] DOB: 1955/08/01 Today's Date: 09/10/2021   History of Present Illness Patient is a 66 y/o female who presents with weakness s/p fall at home, complaining of numbness/tingling in bil feet. Admitted with severe hypokalemia and mild rhabdomyolosis. Recent dental surgery on 8/4 with 5 teeth extractions. PMH includes DM and HTN.   Clinical Impression   [name] was evaluated s/p the above admission list, she is indep at baseline including all ADLs, IADLs and taking care of her mother. Upon evaluation pt was supervision A for bed mobility, min G for transfers and functional ambulation with RW, and min A for LB ADLs. Pt was limited by activity tolerance and generalized weakness. Pt will benefit from acute OT to progress towards her indep baseline. Recommend d/c to home without OT f/u needed.     Recommendations for follow up therapy are one component of a multi-disciplinary discharge planning process, led by the attending physician.  Recommendations may be updated based on patient status, additional functional criteria and insurance authorization.   Follow Up Recommendations  No OT follow up    Assistance Recommended at Discharge Intermittent Supervision/Assistance  Patient can return home with the following A little help with walking and/or transfers;A little help with bathing/dressing/bathroom;Assist for transportation;Help with stairs or ramp for entrance    Functional Status Assessment  Patient has had a recent decline in their functional status and demonstrates the ability to make significant improvements in function in a reasonable and predictable amount of time.  Equipment Recommendations  None recommended by OT       Precautions / Restrictions Precautions Precautions: Fall;Other (comment) Precaution Comments: watch 02 Restrictions Weight Bearing Restrictions: No      Mobility Bed  Mobility Overal bed mobility: Needs Assistance Bed Mobility: Rolling, Sidelying to Sit Rolling: Supervision Sidelying to sit: Supervision, HOB elevated       General bed mobility comments: Use of rail, no physical assist needed.    Transfers Overall transfer level: Needs assistance Equipment used: Rolling walker (2 wheels) Transfers: Sit to/from Stand Sit to Stand: Min guard           General transfer comment: Min guard for safety. Stood from [employer], from toilet x1, transferred to [employer] post ambulation. Cues for hand placement/technique.      Balance Overall balance assessment: Needs assistance Sitting-balance support: Feet supported, No upper extremity supported Sitting balance-Leahy Scale: Good     Standing balance support: During functional activity Standing balance-Leahy Scale: Fair Standing balance comment: Able to stand at sink and wash hands, doesd better wtih UE suppor for walking and for energy conservation.                           ADL either performed or assessed with clinical judgement   ADL Overall ADL's : Needs assistance/impaired Eating/Feeding: Independent;Sitting   Grooming: Supervision/safety;Standing Grooming Details (indicate cue type and reason): at the sink Upper Body Bathing: Set up;Sitting   Lower Body Bathing: Min guard;Sit to/from stand   Upper Body Dressing : Set up;Sitting   Lower Body Dressing: Min guard;Sit to/from stand   Toilet Transfer: Min guard;Ambulation;Rolling walker (2 wheels);Regular Toilet   Toileting- Clothing Manipulation and Hygiene: Supervision/safety;Sitting/lateral lean       Functional mobility during ADLs: Min guard;Rolling walker (2 wheels) General ADL Comments: min G for all standing tasks for safety. Attempted funcitonal ambulation without AD, however pt demonstrated increased safety with  RW     Vision Baseline Vision/History: 0 No visual deficits Ability to See in Adequate Light: 0  Adequate Patient Visual Report: No change from baseline Vision Assessment?: No apparent visual deficits     Perception     Praxis      Pertinent Vitals/Pain Pain Assessment Pain Assessment: No/denies pain     Hand Dominance Right   Extremity/Trunk Assessment Upper Extremity Assessment Upper Extremity Assessment: Overall WFL for tasks assessed   Lower Extremity Assessment Lower Extremity Assessment: Generalized weakness   Cervical / Trunk Assessment Cervical / Trunk Assessment: Normal   Communication Communication Communication: No difficulties   Cognition Arousal/Alertness: Awake/alert Behavior During Therapy: WFL for tasks assessed/performed Overall Cognitive Status: Within Functional Limits for tasks assessed                                       General Comments  Sp02 87% on RA upon arrival. Dropped to 76% on RA during walking, donned 2L and able to maintain in high 80s-90s with walking. placed on 1L at rest.    Exercises     Shoulder Instructions      Home Living Family/patient expects to be discharged to:: Private residence Living Arrangements: Children;Parent Available Help at Discharge: Family;Available PRN/intermittently Type of Home: House Home Access: Stairs to enter [employer] of Steps: 3 Entrance Stairs-Rails: Right Home Layout: One level     Bathroom Shower/Tub: Tub/shower unit;Walk-in shower   Bathroom Toilet: Standard     Home Equipment: Tub Psychologist, forensic (2 wheels)   Additional Comments: mom's DME      Prior Functioning/Environment Prior Level of Function : Independent/Modified Independent             Mobility Comments: Drives, cooks/cleans, cares for mother,w ears 02 at night only ADLs Comments: independent        OT Problem List: Decreased strength;Decreased range of motion;Decreased activity tolerance;Impaired balance (sitting and/or standing);Decreased cognition;Decreased safety  awareness;Decreased knowledge of use of DME or AE;Decreased knowledge of precautions      OT Treatment/Interventions: Self-care/ADL training;Therapeutic exercise;DME and/or AE instruction;Therapeutic activities;Patient/family education;Balance training    OT Goals(Current goals can be found in the care plan section) Acute Rehab OT Goals Patient Stated Goal: home soon OT Goal Formulation: With patient Time For Goal Achievement: 09/24/21 Potential to Achieve Goals: Good ADL Goals Pt Will Perform Lower Body Bathing: Independently;sit to/from stand Pt Will Perform Lower Body Dressing: Independently;sit to/from stand Pt Will Transfer to Toilet: Independently;ambulating Additional ADL Goal #1: Pt will demonstrated increased activty tolerance to complete at least 8 minutes of OOB funcitonal activity without sitting rest break at a mod I level  OT Frequency: Min 2X/week    Co-evaluation              AM-PAC OT "6 Clicks" Daily Activity     Outcome Measure Help from another person eating meals?: None Help from another person taking care of personal grooming?: A Little Help from another person toileting, which includes using toliet, bedpan, or urinal?: A Little Help from another person bathing (including washing, rinsing, drying)?: A Little Help from another person to put on and taking off regular upper body clothing?: None Help from another person to put on and taking off regular lower body clothing?: A Little 6 Click Score: 20   End of Session Equipment Utilized During Treatment: Gait belt;Rolling walker (2 wheels);Oxygen Nurse Communication: Mobility status  Activity Tolerance: Patient tolerated treatment well Patient left: in chair;with call bell/phone within reach;with chair alarm set  OT Visit Diagnosis: Unsteadiness on feet (R26.81);Other abnormalities of gait and mobility (R26.89);History of falling (Z91.81);Muscle weakness (generalized) (M62.81)                Time:  0981-1914 OT Time Calculation (min): 34 min Charges:  OT General Charges $OT Visit: 1 Visit OT Evaluation $OT Eval Moderate Complexity: 1 Mod    Surafel Hilleary A Bianco Cange 09/10/2021, 11:46 AM

## 2021-09-10 NOTE — Progress Notes (Signed)
Date and time results received: 09/10/21 0348 (use smartphrase ".now" to insert current time)  Test: Potassium Critical Value: 2.4  Name of Provider Notified: Foust, NP  Orders Received? Or Actions Taken?: No new orders. Continue with potassium in fluids and Potassium tablets TID.

## 2021-09-10 NOTE — Inpatient Diabetes Management (Signed)
Inpatient Diabetes Program Recommendations  AACE/ADA: New Consensus Statement on Inpatient Glycemic Control (2015)  Target Ranges:  Prepandial:   less than 140 mg/dL      Peak postprandial:   less than 180 mg/dL (1-2 hours)      Critically ill patients:  140 - 180 mg/dL   Lab Results  Component Value Date   GLUCAP 115 (H) 09/10/2021   HGBA1C 9.8 (H) 09/09/2021    Review of Glycemic Control  Latest Reference Range & Units 09/09/21 03:13 09/09/21 11:39 09/09/21 16:22 09/09/21 21:21 09/10/21 07:23  Glucose-Capillary 70 - 99 mg/dL 510 (H) 258 (H) 527 (H) 116 (H) 115 (H)  (H): Data is abnormally high  Diabetes history: DM2 Outpatient Diabetes medications:  Tresiba 65 units QD Jardiance 25 mg QD Amaryl 4 mg BID Actos 45 QD Current orders for Inpatient glycemic control:  Semglee 65 units QD Novolog 0-15 units TID and 0-5 units QHS  Inpatient Diabetes Program Recommendations:    Please consider decreasing Semglee to 33 units QAM (50 % of home dose).    Fasting CBG was 155 mg/dL.   She received 65 units this am.  Spoke with RN to watch for hypoglycemia.    Spoke with patient at bedside.  Reviewed patient's current A1c of .9.8% (average BG of 235 mg/dL). Explained what a A1c is and what it measures. Also reviewed goal A1c with patient, importance of good glucose control @ home, and blood sugar goals.  She admits to drinking sweet tea and not following a healthy diet.  Ordered LWWD booklet.  Discussed basic patho of DM2, educated on The Plate Method, CHO's, portion control, CBGs at home fasting and mid afternoon, F/U with PCP every 3 months, bring meter to PCP office, long and short term complications of uncontrolled BG, and importance of exercise.  She is asking for more information on nutrition.  Will place a RD consult.  Will continue to follow while inpatient.  Thank you, Dulce Sellar, MSN, CDCES Diabetes Coordinator Inpatient Diabetes Program [phone] (team pager from  8a-5p)

## 2021-09-10 NOTE — Progress Notes (Signed)
Date and time results received: 09/10/21 1640   Test: Potassium  Critical Value: 2.7  Name of Provider Notified: Burke Keels, MD   Orders Received? Or Actions Taken?:  Awaiting response   1720- see new orders for kcl po.

## 2021-09-11 LAB — BASIC METABOLIC PANEL
Anion gap: 8 (ref 5–15)
BUN: <5 mg/dL — ABNORMAL LOW (ref 8–23)
CO2: 29 mmol/L (ref 22–32)
Calcium: 8 mg/dL — ABNORMAL LOW (ref 8.9–10.3)
Chloride: 106 mmol/L (ref 98–111)
Creatinine, Ser: 0.63 mg/dL (ref 0.44–1.00)
GFR, Estimated: >60 mL/min (ref >60)
Glucose, Bld: 140 mg/dL — ABNORMAL HIGH (ref 70–99)
Potassium: 3.2 mmol/L — ABNORMAL LOW (ref 3.5–5.1)
Sodium: 143 mmol/L (ref 135–145)

## 2021-09-11 LAB — GLUCOSE, CAPILLARY
Glucose-Capillary: 125 mg/dL — ABNORMAL HIGH (ref 70–99)
Glucose-Capillary: 262 mg/dL — ABNORMAL HIGH (ref 70–99)
Glucose-Capillary: 152 mg/dL — ABNORMAL HIGH (ref 70–99)
Glucose-Capillary: 125 mg/dL — ABNORMAL HIGH (ref 70–99)

## 2021-09-11 LAB — CK: Total CK: 7172 U/L — ABNORMAL HIGH (ref 38–234)

## 2021-09-11 MED ORDER — LACTATED RINGERS IV SOLN: INTRAVENOUS | Status: DC

## 2021-09-11 NOTE — Progress Notes (Signed)
PROGRESS NOTE    Lori Crane  GQQ:[id number] DOB: Oct 05, 1955 DOA: 09/09/2021 PCP: Burnis Medin, PA-C   Brief Narrative:  Lori Crane is a 66 y.o. female with medical history significant of DM; HTN; and HLD presenting with generalized weakness.  She reports that she had oral surgery Friday.  Prior to that, she wasn't eating and was drinking "bad fluids" - tea, V8, sodas.  Her "system was getting off."  She was having diarrhea and took something to control that (Imodium) with improvement. She was scheduled for her surgery, wasn't taking her pills right but was taking her insulin.  She "was just in a tailspin".  Surgery was Friday with 5 teeth extracted.  Before that, she was having to rock to get up, decreased strength but she was able to get up.  She was no longer able to walk as of last night, too weak.  She sat in a chair for about 3 hours, calling for her son and he couldn't hear her.  She fell out of the chair and hit her head on the telephone.  8/8: She was admitted for hypokalemia and rhabdomyolysis with elevated CK levels.  Started on IV fluid and potassium replacement. Hypokalemia improving.  CK still elevated above 7000, some improvement from admission which was above 10,000. Normal saline with potassium was switched with LR.  Subjective. Patient was seen and examined today.  No new complaints.  Her weakness seems improving.  Assessment & Plan:   Principal Problem:   Hypokalemia Active Problems:   Uncontrolled hypertension   Uncontrolled type 2 diabetes mellitus with hyperglycemia, with long-term current use of insulin (HCC)   Rhabdomyolysis   Generalized weakness   Dyslipidemia   Severe hypokalemia Improving, potassium at 3.2 -Patient with decreased PO intake prior to dental extraction and worse after -Replete potassium and monitor   Rhabdomyolysis -CK 10.600 > 5800>7172 -Continue with IV fluid -Monitor CK   Generalized weakness, dental  extractions -As above, likely related to severe hypokalemia and rhabdo -Exacerbated by recent dental extractions; soft diet needed -PT/OT, nutrition, TOC team consults -PT/OT with no specific recommendations -Continue Amoxil to complete course -Continue Peridex -Continue Neurontin   DM -A1c is 9.8, indicative of poor control -hold Jardiance, Amaryl, pioglitazone (has not started, reports she is supposed to take it once a week) -Continue glargine -Cover with moderate-scale SSI    HTN.  Blood pressure elevated -She was on 2 beta-blockers; will hold Coreg and continue metoprolol -Continue telmisartan -As needed hydralazine   HLD -Continue Crestor    Consults: PT/OT; nutrition; TOC team   DVT Prophylaxis: Lovenox Code Status: full    Code Status Orders  (From admission, onward)           Start     Ordered   09/09/21 0848  Full code  Continuous        09/09/21 0850           Code Status History     Date Active Date Inactive Code Status Order ID Comments User Context   06/11/2016 0438 06/14/2016 1534 Full Code [id number]  Eduard Clos, MD ED      Family Communication: discussed in detail with patient at bedside  Disposition Plan:   requires iv hydration, not ready for d/c Consults called: None Admission status: Inpatient   Consultants:  None  Procedures:  No results found.   Objective: Vitals:   09/11/21 0807 09/11/21 1137 09/11/21 1204 09/11/21 1308  BP: (!) 180/86  Marland Kitchen)  197/91 (!) 179/80  Pulse: 81   77  Resp: 19  (!) 24   Temp:  98.2 F (36.8 C)    TempSrc:  Oral    SpO2: 92%   97%  Weight:      Height:        Intake/Output Summary (Last 24 hours) at 09/11/2021 1655 Last data filed at 09/11/2021 1004 Gross per 24 hour  Intake 4531.93 ml  Output 4950 ml  Net -418.07 ml    Filed Weights   09/09/21 0310  Weight: 77.9 kg    Examination:  General.  Well-developed lady, in no acute distress. Pulmonary.  Lungs clear bilaterally,  normal respiratory effort. CV.  Regular rate and rhythm, no JVD, rub or murmur. Abdomen.  Soft, nontender, nondistended, BS positive. CNS.  Alert and oriented .  No focal neurologic deficit. Extremities.  No edema, no cyanosis, pulses intact and symmetrical. Psychiatry.  Judgment and insight appears normal.    Data Reviewed: I have personally reviewed following labs and imaging studies  CBC: Recent Labs  Lab 09/09/21 0348 09/10/21 0206  WBC 7.2 5.5  NEUTROABS 5.3  --   HGB 12.9 10.3*  HCT 39.8 31.6*  MCV 83.3 83.2  PLT 216 173    Basic Metabolic Panel: Recent Labs  Lab 09/09/21 0348 09/09/21 0927 09/09/21 1743 09/10/21 0206 09/10/21 1509 09/11/21 0615  NA 145 146* 144 145 143 143  K <2.0* <2.0* <2.0* 2.4* 2.7* 3.2*  CL 97* 101 104 107 105 106  CO2 36* 37* 34* 32 32 29  GLUCOSE 253* 253* 155* 143* 109* 140*  BUN <5* <5* <5* <5* <5* <5*  CREATININE 0.91 0.82 0.66 0.67 0.64 0.63  CALCIUM 9.3 8.4* 8.0* 7.7* 8.1* 8.0*  MG 2.6*  --   --   --   --   --     GFR: Estimated Creatinine Clearance: 72.8 mL/min (by C-G formula based on SCr of 0.63 mg/dL). Liver Function Tests: Recent Labs  Lab 09/09/21 0348 09/10/21 0206  AST 180* 117*  ALT 67* 47*  ALKPHOS 78 53  BILITOT 0.9 0.7  PROT 7.4 5.4*  ALBUMIN 3.2* 2.4*    No results for input(s): "LIPASE", "AMYLASE" in the last 168 hours. No results for input(s): "AMMONIA" in the last 168 hours. Coagulation Profile: No results for input(s): "INR", "PROTIME" in the last 168 hours. Cardiac Enzymes: Recent Labs  Lab 09/09/21 0348 09/10/21 0206 09/10/21 1754 09/11/21 0615  CKTOTAL 10,677* 5,983* 9,089* 7,172*    BNP (last 3 results) No results for input(s): "PROBNP" in the last 8760 hours. HbA1C: Recent Labs    09/09/21 0927  HGBA1C 9.8*    CBG: Recent Labs  Lab 09/10/21 1143 09/10/21 1459 09/10/21 2110 09/11/21 0738 09/11/21 1134  GLUCAP 259* 114* 100* 125* 262*    Lipid Profile: No results for  input(s): "CHOL", "HDL", "LDLCALC", "TRIG", "CHOLHDL", "LDLDIRECT" in the last 72 hours. Thyroid Function Tests: No results for input(s): "TSH", "T4TOTAL", "FREET4", "T3FREE", "THYROIDAB" in the last 72 hours. Anemia Panel: No results for input(s): "VITAMINB12", "FOLATE", "FERRITIN", "TIBC", "IRON", "RETICCTPCT" in the last 72 hours. Sepsis Labs: No results for input(s): "PROCALCITON", "LATICACIDVEN" in the last 168 hours.  Recent Results (from the past 240 hour(s))  MRSA Next Gen by PCR, Nasal     Status: None   Collection Time: 09/09/21  2:33 PM   Specimen: Nasal Mucosa; Nasal Swab  Result Value Ref Range Status   MRSA by PCR Next Gen NOT DETECTED NOT  DETECTED Final    Comment: (NOTE) The GeneXpert MRSA Assay (FDA approved for NASAL specimens only), is one component of a comprehensive MRSA colonization surveillance program. It is not intended to diagnose MRSA infection nor to guide or monitor treatment for MRSA infections. Test performance is not FDA approved in patients less than 70 years old. Performed at [facility], [address] [place], [place] [address]          Radiology Studies: No results found.      Scheduled Meds:  amoxicillin  500 mg Oral TID   aspirin EC  81 mg Oral Daily   chlorhexidine  15 mL Mouth/Throat BID   enoxaparin (LOVENOX) injection  40 mg Subcutaneous Q24H   gabapentin  600 mg Oral BID   insulin aspart  0-15 Units Subcutaneous TID WC   insulin aspart  0-5 Units Subcutaneous QHS   insulin glargine-yfgn  65 Units Subcutaneous Daily   irbesartan  300 mg Oral Daily   metoprolol tartrate  100 mg Oral BID   potassium chloride  40 mEq Oral BID   sodium chloride flush  3 mL Intravenous Q12H   Continuous Infusions:  lactated ringers       LOS: 1 day    Time spent: 38  min  This record has been created using Conservation officer, historic buildings. Errors have been sought and corrected,but may not always be located. Such creation errors  do not reflect on the standard of care.   Arnetha Courser, MD [facility]  If 7PM-7AM, please contact night-coverage  09/11/2021, 4:55 PM

## 2021-09-11 NOTE — Progress Notes (Signed)
Occupational Therapy Treatment Patient Details Name: Lori Crane MRN: [id number] DOB: [birth year] Today's Date: 09/11/2021   History of present illness Patient is a 66 y/o female who presents with weakness s/p fall at home, complaining of numbness/tingling in bil feet. Admitted with severe hypokalemia and mild rhabdomyolosis. Recent dental surgery on 8/4 with 5 teeth extractions. PMH includes DM and HTN.   OT comments  Pt is making excellent progress with notable improvements in activity tolerance, balance, need for AD and supplemental O2. Overall she was supervision for toileting, hygiene, grooming and bathing in the bathroom this date with no LOB, AD or rest breaks required. Pt continue to benefit to progress to indep level. POC remains appropriate.    Recommendations for follow up therapy are one component of a multi-disciplinary discharge planning process, led by the attending physician.  Recommendations may be updated based on patient status, additional functional criteria and insurance authorization.    Follow Up Recommendations  No OT follow up    Assistance Recommended at Discharge Intermittent Supervision/Assistance  Patient can return home with the following  A little help with walking and/or transfers;A little help with bathing/dressing/bathroom;Assist for transportation;Help with stairs or ramp for entrance   Equipment Recommendations  None recommended by OT    Recommendations for Other Services      Precautions / Restrictions Precautions Precautions: Fall;Other (comment) Precaution Comments: watch 02 Restrictions Weight Bearing Restrictions: No       Mobility Bed Mobility               General bed mobility comments: pt OOB in the chair upon arrival    Transfers Overall transfer level: Needs assistance Equipment used: None Transfers: Sit to/from Stand Sit to Stand: Supervision                 Balance Overall balance assessment: Needs  assistance Sitting-balance support: Feet supported, No upper extremity supported Sitting balance-Leahy Scale: Good     Standing balance support: During functional activity Standing balance-Leahy Scale: Fair Standing balance comment: Able to stand at sink and wash hands, no LOB.                           ADL either performed or assessed with clinical judgement   ADL Overall ADL's : Needs assistance/impaired     Grooming: Supervision/safety;Standing       Lower Body Bathing: Supervison/ safety;Sit to/from stand Lower Body Bathing Details (indicate cue type and reason): at the sink         Toilet Transfer: Supervision/safety;Ambulation Toilet Transfer Details (indicate cue type and reason): no RW this date Toileting- Architect and Hygiene: Supervision/safety;Sit to/from stand       Functional mobility during ADLs: Supervision/safety General ADL Comments: no RW needed this date, SpO2 >90%    Extremity/Trunk Assessment Upper Extremity Assessment Upper Extremity Assessment: Overall WFL for tasks assessed   Lower Extremity Assessment Lower Extremity Assessment: Defer to PT evaluation        Vision   Vision Assessment?: No apparent visual deficits   Perception     Praxis      Cognition Arousal/Alertness: Awake/alert Behavior During Therapy: WFL for tasks assessed/performed Overall Cognitive Status: Within Functional Limits for tasks assessed  Exercises      Shoulder Instructions       General Comments VSS on RA    Pertinent Vitals/ Pain       Pain Assessment Pain Assessment: No/denies pain  Home Living                                          Prior Functioning/Environment              Frequency  Min 2X/week        Progress Toward Goals  OT Goals(current goals can now be found in the care plan section)  Progress towards OT goals:  Progressing toward goals  Acute Rehab OT Goals Patient Stated Goal: hoem tomorrow OT Goal Formulation: With patient Time For Goal Achievement: 09/24/21 Potential to Achieve Goals: Good ADL Goals Pt Will Perform Lower Body Bathing: Independently;sit to/from stand Pt Will Perform Lower Body Dressing: Independently;sit to/from stand Pt Will Transfer to Toilet: Independently;ambulating Additional ADL Goal #1: Pt will demonstrated increased activty tolerance to complete at least 8 minutes of OOB funcitonal activity without sitting rest break at a mod I level  Plan Discharge plan remains appropriate    Co-evaluation                 AM-PAC OT "6 Clicks" Daily Activity     Outcome Measure   Help from another person eating meals?: None Help from another person taking care of personal grooming?: A Little Help from another person toileting, which includes using toliet, bedpan, or urinal?: A Little Help from another person bathing (including washing, rinsing, drying)?: A Little Help from another person to put on and taking off regular upper body clothing?: None Help from another person to put on and taking off regular lower body clothing?: A Little 6 Click Score: 20    End of Session Equipment Utilized During Treatment: Gait belt  OT Visit Diagnosis: Unsteadiness on feet (R26.81);Other abnormalities of gait and mobility (R26.89);History of falling (Z91.81);Muscle weakness (generalized) (M62.81)   Activity Tolerance Patient tolerated treatment well   Patient Left in chair;with call bell/phone within reach   Nurse Communication Mobility status        Time: 8250-5397 OT Time Calculation (min): 17 min  Charges: OT General Charges $OT Visit: 1 Visit OT Treatments $Self Care/Home Management : 8-22 mins    Arlita Buffkin A Cleto Claggett 09/11/2021, 5:22 PM

## 2021-09-11 NOTE — Progress Notes (Addendum)
Physical Therapy Treatment Patient Details Name: Lori Crane MRN: [id number] DOB: 01-11-1956 Today's Date: 09/11/2021   History of Present Illness Patient is a 66 y/o female who presents with weakness s/p fall at home, complaining of numbness/tingling in bil feet. Admitted with severe hypokalemia and mild rhabdomyolosis. Recent dental surgery on 8/4 with 5 teeth extractions. PMH includes DM and HTN.    PT Comments    Patient progressing well towards PT goals. Pt eager to see the MD and go home today. Session focused on stair training and progressive ambulation to prepare for d/c home. Continues to demonstrate 2/4 DOE requiring standing and seated rest breaks during activity. Gait training performed without use of DME today. Requires Min guard assist for stair negotiation. Cues to self monitoring of symptoms. Sp02 remained >89% on RA throughout session. Continues to demonstrate deficits related to endurance, dyspnea and overall mobility. Recommend mobility tech follow to increase walking/activity. Will follow.   Recommendations for follow up therapy are one component of a multi-disciplinary discharge planning process, led by the attending physician.  Recommendations may be updated based on patient status, additional functional criteria and insurance authorization.  Follow Up Recommendations  No PT follow up     Assistance Recommended at Discharge PRN  Patient can return home with the following A little help with bathing/dressing/bathroom;A little help with walking and/or transfers;Assistance with cooking/housework;Assist for transportation;Help with stairs or ramp for entrance   Equipment Recommendations  None recommended by PT    Recommendations for Other Services       Precautions / Restrictions Precautions Precautions: Fall;Other (comment) Precaution Comments: watch 02 Restrictions Weight Bearing Restrictions: No     Mobility  Bed Mobility               General  bed mobility comments: Up in chair upon PT arrival.    Transfers Overall transfer level: Needs assistance Equipment used: None Transfers: Sit to/from Stand Sit to Stand: Min guard, Supervision           General transfer comment: Min guard for safety. Stood from [employer], from toilet x1, from bench x1.    Ambulation/Gait Ambulation/Gait assistance: Min guard Gait Distance (Feet): 120 Feet (x2 bouts) Assistive device: None Gait Pattern/deviations: Step-through pattern, Decreased stride length, Drifts right/left Gait velocity: decreased Gait velocity interpretation: <1.31 ft/sec, indicative of household ambulator   General Gait Details: Slow, mildly unsteady gait without use of DME, 2 standing rest breaks and 1 seated rest break. 2/4 DOE. Sp02 remained >89% on RA.   Stairs Stairs: Yes Stairs assistance: Min guard Stair Management: Step to pattern, One rail Right Number of Stairs: 3 General stair comments: Cues for step to gait and for safety, 2-3.4 DOE noted post stairs needing rest break.   Wheelchair Mobility    Modified Rankin (Stroke Patients Only)       Balance Overall balance assessment: Needs assistance Sitting-balance support: Feet supported, No upper extremity supported Sitting balance-Leahy Scale: Good Sitting balance - Comments: Able to perform pericare without assist   Standing balance support: During functional activity Standing balance-Leahy Scale: Fair Standing balance comment: Able to stand at sink and wash hands, no LOB.                            Cognition Arousal/Alertness: Awake/alert Behavior During Therapy: WFL for tasks assessed/performed Overall Cognitive Status: Within Functional Limits for tasks assessed  Exercises      General Comments General comments (skin integrity, edema, etc.): VSS on RA with sp02 >89% lowest observed during session.      Pertinent  Vitals/Pain Pain Assessment Pain Assessment: No/denies pain    Home Living                          Prior Function            PT Goals (current goals can now be found in the care plan section) Progress towards PT goals: Progressing toward goals    Frequency    Min 3X/week      PT Plan Current plan remains appropriate    Co-evaluation              AM-PAC PT "6 Clicks" Mobility   Outcome Measure  Help needed turning from your back to your side while in a flat bed without using bedrails?: A Little Help needed moving from lying on your back to sitting on the side of a flat bed without using bedrails?: A Little Help needed moving to and from a bed to a chair (including a wheelchair)?: A Little Help needed standing up from a chair using your arms (e.g., wheelchair or bedside chair)?: A Little Help needed to walk in hospital room?: A Little Help needed climbing 3-5 steps with a railing? : A Little 6 Click Score: 18    End of Session Equipment Utilized During Treatment: Gait belt Activity Tolerance: Patient tolerated treatment well;Patient limited by fatigue Patient left: in chair;with call bell/phone within reach Nurse Communication: Mobility status PT Visit Diagnosis: Difficulty in walking, not elsewhere classified (R26.2);Muscle weakness (generalized) (M62.81)     Time: 7106-2694 PT Time Calculation (min) (ACUTE ONLY): 23 min  Charges:  $Gait Training: 8-22 mins $Therapeutic Exercise: 8-22 mins                     Vale Haven, PT, DPT Acute Rehabilitation Services Secure chat preferred Office [phone]      Blake Divine A Lanier Ensign 09/11/2021, 12:46 PM

## 2021-09-12 DIAGNOSIS — K047 Periapical abscess without sinus: Secondary | ICD-10-CM | POA: Diagnosis present

## 2021-09-12 DIAGNOSIS — E1140 Type 2 diabetes mellitus with diabetic neuropathy, unspecified: Secondary | ICD-10-CM | POA: Diagnosis present

## 2021-09-12 DIAGNOSIS — D649 Anemia, unspecified: Secondary | ICD-10-CM | POA: Diagnosis present

## 2021-09-12 DIAGNOSIS — E870 Hyperosmolality and hypernatremia: Secondary | ICD-10-CM | POA: Diagnosis present

## 2021-09-12 LAB — BASIC METABOLIC PANEL
Anion gap: 10 (ref 5–15)
BUN: <5 mg/dL — ABNORMAL LOW (ref 8–23)
CO2: 25 mmol/L (ref 22–32)
Calcium: 8.3 mg/dL — ABNORMAL LOW (ref 8.9–10.3)
Chloride: 106 mmol/L (ref 98–111)
Creatinine, Ser: 0.62 mg/dL (ref 0.44–1.00)
GFR, Estimated: >60 mL/min (ref >60)
Glucose, Bld: 94 mg/dL (ref 70–99)
Potassium: 3.1 mmol/L — ABNORMAL LOW (ref 3.5–5.1)
Sodium: 141 mmol/L (ref 135–145)

## 2021-09-12 LAB — GLUCOSE, CAPILLARY: Glucose-Capillary: 125 mg/dL — ABNORMAL HIGH (ref 70–99)

## 2021-09-12 LAB — CK: Total CK: 4755 U/L — ABNORMAL HIGH (ref 38–234)

## 2021-09-12 MED ORDER — LACTATED RINGERS IV BOLUS: 1000.0000 mL | Freq: Once | INTRAVENOUS | Status: DC

## 2021-09-12 MED ORDER — POTASSIUM CHLORIDE 20 MEQ PO PACK
60.0000 meq | PACK | Freq: Once | ORAL | Status: AC
Start: 2021-09-12 — End: 2021-09-12
  Administered 2021-09-12: 60 meq via ORAL
  Filled 2021-09-12: qty 3

## 2021-09-12 MED ORDER — POTASSIUM CHLORIDE CRYS ER 20 MEQ PO TBCR
40.0000 meq | EXTENDED_RELEASE_TABLET | ORAL | Status: DC
  Administered 2021-09-12: 40 meq via ORAL
  Filled 2021-09-12: qty 2

## 2021-09-12 MED ORDER — FUROSEMIDE 20 MG PO TABS: 20.0000 mg | ORAL_TABLET | Freq: Every day | ORAL | Status: AC

## 2021-09-12 MED ORDER — POTASSIUM CHLORIDE 20 MEQ PO PACK: 20.0000 meq | PACK | Freq: Every day | ORAL | 0 refills | Status: DC

## 2021-09-12 NOTE — Discharge Summary (Signed)
Physician Discharge Summary   Patient: Lori Crane MRN: [id number] DOB: 01-04-1956  Admit date:     09/09/2021  Discharge date: 09/12/2021  Discharge Physician: Lynden Oxford  PCP: Burnis Medin, PA-C  Recommendations at discharge: Follow-up with PCP in 1 week with repeat BMP and CK levels Drink plenty of water. Minimize exertional activities.  Discharge Diagnoses: Principal Problem:   Hypokalemia Active Problems:   Generalized weakness   Non-traumatic rhabdomyolysis   Dyslipidemia   Uncontrolled type 2 diabetes mellitus with hyperglycemia, with long-term current use of insulin (HCC)   Uncontrolled hypertension   Hypernatremia   Normocytic anemia   Dental infection   Diabetic neuropathy (HCC)  Assessment and Plan: Principal Problem:   Hypokalemia Most likely this is in the setting of poor p.o. intake is along with taking Lasix. Currently replaced aggressively. Will continue oral potassium replacement outpatient. Follow-up with PCP with repeat BMP level.  Active Problems:   Generalized weakness Most likely this is in the setting of severe hypokalemia. Potassium level was undetectable at the time of admission. Now corrected close to normal. Recommend to continue oral potassium on discharge. Follow-up with PCP with repeat labs. Ambulating in the hallway without any assistance.    Non-traumatic rhabdomyolysis Baseline CK level normal.  CK on admission was 10,000.  Now trending down to 4000. Patient was recently transition from simvastatin 20 mg to Crestor 20 mg. Suspect this is likely the cause for rhabdomyolysis. Patient does not have any tenderness or swelling in lower extremities or upper extremities. At present recommend to hold statin and monitor CK level and follow-up with PCP and cardiology.    Dyslipidemia Hold statin.    Uncontrolled type 2 diabetes mellitus with hyperglycemia, with long-term current use of insulin (HCC) Hemoglobin A1c  9.8. Uncontrolled. Patient follows up and have establish care with endocrinologist. Will continue home regimen on discharge.    Uncontrolled hypertension Recently started seeing cardiology. Her medications have been altered recently. Will continue home regimen on discharge. Patient is scheduled to see cardiology for stress test on August 21. Recommend to inform them that her CK level has been elevated due to statins.    Hypernatremia Mild. Resolved with fluid.    Normocytic anemia Mild. Hemoglobin around 10. Recommend follow-up with PCP. No bleeding reported.      Dental infection On oral amoxicillin. Outpatient follow-up with dentistry. Continue soft diet per recommendation.    Diabetic neuropathy (HCC) On gabapentin. Will continue.   Consultants: None Procedures performed:  none DISCHARGE MEDICATION: Allergies as of 09/12/2021       Reactions   Metformin And Related Other (See Comments)   Severe Yeast infection        Medication List     STOP taking these medications    metoprolol tartrate 50 MG tablet Commonly known as: LOPRESSOR   rosuvastatin 20 MG tablet Commonly known as: CRESTOR       TAKE these medications    acetaminophen 325 MG tablet Commonly known as: TYLENOL Take 2 tablets (650 mg total) by mouth every 6 (six) hours as needed for mild pain, fever or headache.   amoxicillin 500 MG capsule Commonly known as: AMOXIL Take 500 mg by mouth 3 (three) times daily.   aspirin EC 81 MG tablet Take 81 mg by mouth daily. Swallow whole.   carvedilol 25 MG tablet Commonly known as: COREG Take 25 mg by mouth 2 (two) times daily.   chlorhexidine 0.12 % solution Commonly known as: PERIDEX Use as  directed 15 mLs in the mouth or throat 2 (two) times daily.   fluconazole 150 MG tablet Commonly known as: DIFLUCAN Take 150 mg by mouth once.   furosemide 20 MG tablet Commonly known as: LASIX Take 1 tablet (20 mg total) by mouth daily. Start  taking on: September 17, 2021 What changed: These instructions start on September 17, 2021. If you are unsure what to do until then, ask your doctor or other care provider.   gabapentin 300 MG capsule Commonly known as: NEURONTIN Take 600 mg by mouth 2 (two) times daily.   glimepiride 4 MG tablet Commonly known as: AMARYL Take 4 mg by mouth 2 (two) times a day.   HYDROcodone-acetaminophen 5-325 MG tablet Commonly known as: NORCO/VICODIN Take 1 tablet by mouth every 4 (four) hours as needed. What changed: reasons to take this   insulin degludec 100 UNIT/ML FlexTouch Pen Commonly known as: TRESIBA Inject 65 Units into the skin daily.   Jardiance 25 MG Tabs tablet Generic drug: empagliflozin Take 25 mg by mouth daily.   pantoprazole 40 MG tablet Commonly known as: PROTONIX Take 1 tablet (40 mg total) by mouth daily. What changed:  when to take this reasons to take this   pioglitazone 45 MG tablet Commonly known as: ACTOS Take 45 mg by mouth daily.   potassium chloride 20 MEQ packet Commonly known as: KLOR-CON Take 20 mEq by mouth daily.   telmisartan 80 MG tablet Commonly known as: MICARDIS Take 80 mg by mouth daily.        Follow-up Information     [place], [place], [place]. Schedule an appointment as soon as possible for a visit in 1 week(s).   Specialty: Family Medicine Why: check BMP and CK levels Contact information: [address] [place] [address] [phone]                Disposition: Home Diet recommendation: Soft diet  Discharge Exam: Vitals:   09/11/21 2336 09/12/21 0358 09/12/21 0750 09/12/21 1053  BP: (!) 175/88 (!) 180/95 (!) 218/111 (!) 156/103  Pulse: 74 73 79 90  Resp: 18 18 20    Temp: 98.3 F (36.8 C) 98.3 F (36.8 C) 97.7 F (36.5 C)   TempSrc:  Oral Oral   SpO2: 98% 95% 98%   Weight:      Height:       General: Appear in no distress; no visible Abnormal Neck Mass Or lumps, Conjunctiva  normal Cardiovascular: S1 and S2 Present, no Murmur, Respiratory: good respiratory effort, Bilateral Air entry present and CTA, no Crackles, no wheezes Abdomen: Bowel Sound present, Non tender  Extremities: no Pedal edema Neurology: alert and oriented to time, place, and person  Gait not checked due to patient safety concerns Filed Weights   09/09/21 0310  Weight: 77.9 kg   Condition at discharge: stable  The results of significant diagnostics from this hospitalization (including imaging, microbiology, ancillary and laboratory) are listed below for reference.   Imaging Studies: No results found.  Microbiology: Results for orders placed or performed during the hospital encounter of 09/09/21  MRSA Next Gen by PCR, Nasal     Status: None   Collection Time: 09/09/21  2:33 PM   Specimen: Nasal Mucosa; Nasal Swab  Result Value Ref Range Status   MRSA by PCR Next Gen NOT DETECTED NOT DETECTED Final    Comment: (NOTE) The GeneXpert MRSA Assay (FDA approved for NASAL specimens only), is one component of a comprehensive MRSA  colonization surveillance program. It is not intended to diagnose MRSA infection nor to guide or monitor treatment for MRSA infections. Test performance is not FDA approved in patients less than 17 years old. Performed at [facility], [address], [place], [place] [address]     Labs: CBC: Recent Labs  Lab 09/09/21 0348 09/10/21 0206  WBC 7.2 5.5  NEUTROABS 5.3  --   HGB 12.9 10.3*  HCT 39.8 31.6*  MCV 83.3 83.2  PLT 216 173   Basic Metabolic Panel: Recent Labs  Lab 09/09/21 0348 09/09/21 0927 09/09/21 1743 09/10/21 0206 09/10/21 1509 09/11/21 0615 09/12/21 0226  NA 145   < > 144 145 143 143 141  K <2.0*   < > <2.0* 2.4* 2.7* 3.2* 3.1*  CL 97*   < > 104 107 105 106 106  CO2 36*   < > 34* 32 32 29 25  GLUCOSE 253*   < > 155* 143* 109* 140* 94  BUN <5*   < > <5* <5* <5* <5* <5*  CREATININE 0.91   < > 0.66 0.67 0.64 0.63 0.62  CALCIUM  9.3   < > 8.0* 7.7* 8.1* 8.0* 8.3*  MG 2.6*  --   --   --   --   --   --    < > = values in this interval not displayed.   Liver Function Tests: Recent Labs  Lab 09/09/21 0348 09/10/21 0206  AST 180* 117*  ALT 67* 47*  ALKPHOS 78 53  BILITOT 0.9 0.7  PROT 7.4 5.4*  ALBUMIN 3.2* 2.4*   CBG: Recent Labs  Lab 09/11/21 0738 09/11/21 1134 09/11/21 1702 09/11/21 2129 09/12/21 0751  GLUCAP 125* 262* 152* 125* 125*    Discharge time spent: greater than 30 minutes.  Signed: Lynden Oxford, MD [facility] 09/12/2021

## 2021-09-12 NOTE — Progress Notes (Addendum)
Bp this morning 218/11, 5mg  apresoline given IV. Patient unhappy with having to take the potassium pills. "The pills are too big to swallow and she gags" per the patient. She wants to go home no matter what today. Will continue to monitor BP.  0900 Patient refused to let the NT retake her BP, she had an accident with BM and stated her BP will be up bc she pooped on herself. I will retake when appropriate.

## 2022-03-21 ENCOUNTER — Emergency Department (HOSPITAL_COMMUNITY)
Admission: EM | Admit: 2022-03-21 | Discharge: 2022-03-21 | Disposition: A | Discharge status: Home / Self Care | Payer: Medicare Other | Attending: Emergency Medicine | Admitting: Emergency Medicine

## 2022-03-21 ENCOUNTER — Other Ambulatory Visit: Payer: Self-pay

## 2022-03-21 DIAGNOSIS — I10 Essential (primary) hypertension: Secondary | ICD-10-CM | POA: Diagnosis not present

## 2022-03-21 DIAGNOSIS — Z7982 Long term (current) use of aspirin: Secondary | ICD-10-CM | POA: Diagnosis not present

## 2022-03-21 DIAGNOSIS — L509 Urticaria, unspecified: Secondary | ICD-10-CM | POA: Diagnosis not present

## 2022-03-21 DIAGNOSIS — Z7984 Long term (current) use of oral hypoglycemic drugs: Secondary | ICD-10-CM | POA: Insufficient documentation

## 2022-03-21 DIAGNOSIS — E119 Type 2 diabetes mellitus without complications: Secondary | ICD-10-CM | POA: Insufficient documentation

## 2022-03-21 DIAGNOSIS — Z79899 Other long term (current) drug therapy: Secondary | ICD-10-CM | POA: Diagnosis not present

## 2022-03-21 DIAGNOSIS — R21 Rash and other nonspecific skin eruption: Secondary | ICD-10-CM | POA: Diagnosis present

## 2022-03-21 DIAGNOSIS — Z794 Long term (current) use of insulin: Secondary | ICD-10-CM | POA: Diagnosis not present

## 2022-03-21 MED ORDER — HYDROXYZINE HCL 25 MG PO TABS: 25.0000 mg | ORAL_TABLET | Freq: Four times a day (QID) | ORAL | 0 refills | Status: AC

## 2022-03-21 NOTE — ED Triage Notes (Signed)
Pt reports generalized hives to torso ,face, and extremities x1.5 month Relief with benadryl  Doctor prescribe zytec and a cream w/o relief.  Pt reports started after taking ozempic.

## 2022-03-21 NOTE — ED Provider Notes (Signed)
[place] EMERGENCY DEPARTMENT AT [facility] Provider Note   CSN: [id number] Arrival date & time: 03/21/22  1034     History  Chief Complaint  Patient presents with   Rash    Lori Crane is a 67 y.o. female.  The history is provided by the patient and medical records. No language interpreter was used.  Rash    67 year old female with significant history of uncontrolled type 2 diabetes, DVT, hypertension presenting complaining of hives.  Patient states since she started taking Ozempic approximately 2 months ago she has had recurrent hives.  Hives manifest throughout her body, very itchy and driving her nuts.  She mention she has seen and evaluated by her doctor and was prescribed steroid cream as well as Benadryl.  She took medication with minimal relief.  She was also prescribed Zyrtec as well but tonight her symptoms bother her greatly prompting this ER visit.  She does not complain of any tongue swelling or lip swelling no trouble breathing no abdominal cramping no other environmental changes.  No other medication changes.  Home Medications Prior to Admission medications   Medication Sig Start Date End Date Taking? Authorizing Provider  acetaminophen (TYLENOL) 325 MG tablet Take 2 tablets (650 mg total) by mouth every 6 (six) hours as needed for mild pain, fever or headache. 08/08/18   Elgergawy, Silver Huguenin, MD  amoxicillin (AMOXIL) 500 MG capsule Take 500 mg by mouth 3 (three) times daily. 09/06/21   [provider]  aspirin EC 81 MG tablet Take 81 mg by mouth daily. Swallow whole.    [provider]  carvedilol (COREG) 25 MG tablet Take 25 mg by mouth 2 (two) times daily. 07/19/21   [provider]  chlorhexidine (PERIDEX) 0.12 % solution Use as directed 15 mLs in the mouth or throat 2 (two) times daily. 09/06/21   [provider]  empagliflozin (JARDIANCE) 25 MG TABS tablet Take 25 mg by mouth daily.  04/30/16   [provider]  fluconazole (DIFLUCAN) 150 MG tablet Take 150 mg by mouth once. 07/25/21   [provider]  furosemide (LASIX) 20 MG tablet Take 1 tablet (20 mg total) by mouth daily. 09/17/21   Lavina Hamman, MD  gabapentin (NEURONTIN) 300 MG capsule Take 600 mg by mouth 2 (two) times daily. 04/08/16   [provider]  glimepiride (AMARYL) 4 MG tablet Take 4 mg by mouth 2 (two) times a day. 02/02/18   [provider]  HYDROcodone-acetaminophen (NORCO/VICODIN) 5-325 MG tablet Take 1 tablet by mouth every 4 (four) hours as needed. Patient taking differently: Take 1 tablet by mouth every 4 (four) hours as needed for moderate pain or severe pain. 02/27/17   Charlann Lange, PA-C  insulin degludec (TRESIBA) 100 UNIT/ML SOPN FlexTouch Pen Inject 65 Units into the skin daily. 02/02/18   [provider]  pantoprazole (PROTONIX) 40 MG tablet Take 1 tablet (40 mg total) by mouth daily. Patient taking differently: Take 40 mg by mouth daily as needed (Indigestion). 08/08/18   Elgergawy, Silver Huguenin, MD  pioglitazone (ACTOS) 45 MG tablet Take 45 mg by mouth daily. 07/09/21   [provider]  potassium chloride (KLOR-CON) 20 MEQ packet Take 20 mEq by mouth daily. 09/12/21   Lavina Hamman, MD  telmisartan (MICARDIS) 80 MG tablet Take 80 mg by mouth daily. 07/19/21   [provider]      Allergies    Metformin and related    Review  of Systems   Review of Systems  Skin:  Positive for rash.  All other systems reviewed and are negative.   Physical Exam Updated Vital Signs BP (!) 122/98 (BP Location: Left Arm)   Pulse 74   Temp 98.1 F (36.7 C) (Oral)   Resp 16   Wt 80.7 kg   SpO2 100%   BMI 28.73 kg/m  Physical Exam Vitals and nursing note reviewed.  Constitutional:      General: She is not in acute distress.    Appearance: She is well-developed.  HENT:     Head: Atraumatic.     Mouth/Throat:     Mouth: Mucous membranes are moist.     Comments: No lip edema or  tongue edema. Eyes:     Conjunctiva/sclera: Conjunctivae normal.  Cardiovascular:     Rate and Rhythm: Normal rate and regular rhythm.  Pulmonary:     Effort: Pulmonary effort is normal.     Breath sounds: No wheezing, rhonchi or rales.     Comments: No wheezes. Musculoskeletal:     Cervical back: Neck supple.  Skin:    Findings: No rash (Urticaria noted throughout body without any signs of infection.).  Neurological:     Mental Status: She is alert.  Psychiatric:        Mood and Affect: Mood normal.     ED Results / Procedures / Treatments   Labs (all labs ordered are listed, but only abnormal results are displayed) Labs Reviewed - No data to display  EKG None  Radiology No results found.  Procedures Procedures    Medications Ordered in ED Medications - No data to display  ED Course/ Medical Decision Making/ A&P                             Medical Decision Making  BP (!) 122/98 (BP Location: Left Arm)   Pulse 74   Temp 98.1 F (36.7 C) (Oral)   Resp 16   Wt 80.7 kg   SpO2 100%   BMI 28.73 kg/m   40:47 AM 67 year old female with significant history of uncontrolled type 2 diabetes, DVT, hypertension presenting complaining of hives.  Patient states since she started taking Ozempic approximately 2 months ago she has had recurrent hives.  Hives manifest throughout her body, very itchy and driving her nuts.  She mention she has seen and evaluated by her doctor and was prescribed steroid cream as well as Benadryl.  She took medication with minimal relief.  She was also prescribed Zyrtec as well but tonight her symptoms bother her greatly prompting this ER visit.  She does not complain of any tongue swelling or lip swelling no trouble breathing no abdominal cramping no other environmental changes.  No other medication changes.  On exam this is a well-appearing female with evidence of urticaria noted throughout her body.  She does not have any signs concerning for  anaphylactic reaction.  No tongue swelling no wheezes no abdominal cramping.  Vital signs reviewed and overall reassuring.  Urticaria is likely secondary to allergic reaction from [place].  No other changes in her environment to account for this allergic reaction.  No concerning cellulitic rash or any infectious symptoms.  Due to history of poorly controlled diabetes I felt prednisone is not indicated.  No anaphylaxis therefore no recommendation for epinephrine.  After further discussion, I felt patient should discontinue her Ozempic, continue with her current therapy which includes Benadryl,  steroid cream, and Zyrtec.  I gave patient return precaution.  Care discussed with attending Dr. Jeanell Sparrow.  Social determinants of health include tobacco use.        Final Clinical Impression(s) / ED Diagnoses Final diagnoses:  Urticaria    Rx / DC Orders ED Discharge Orders          Ordered    hydrOXYzine (ATARAX) 25 MG tablet  Every 6 hours        03/21/22 1100              Domenic Moras, PA-C 03/21/22 1102    Pattricia Boss, MD 03/23/22 [id number]

## 2022-03-21 NOTE — Discharge Instructions (Addendum)
You have been evaluated for your symptoms.  Your hives are likely due to allergic reaction to Ozempic.  Please discontinue this medication and follow-up closely with your primary care doctor for an alternative treatment.  You may continue to take Benadryl and use prednisone cream that was previously prescribed.  You may take Atarax as needed for your itchiness.  Return if you have any concern.

## 2023-07-18 ENCOUNTER — Emergency Department (HOSPITAL_COMMUNITY)
Admission: EM | Admit: 2023-07-18 | Discharge: 2023-07-18 | Disposition: A | Discharge status: Home / Self Care | Attending: Emergency Medicine | Admitting: Emergency Medicine

## 2023-07-18 ENCOUNTER — Other Ambulatory Visit: Payer: Self-pay

## 2023-07-18 DIAGNOSIS — R112 Nausea with vomiting, unspecified: Secondary | ICD-10-CM | POA: Insufficient documentation

## 2023-07-18 DIAGNOSIS — Z794 Long term (current) use of insulin: Secondary | ICD-10-CM | POA: Diagnosis not present

## 2023-07-18 DIAGNOSIS — Z7982 Long term (current) use of aspirin: Secondary | ICD-10-CM | POA: Insufficient documentation

## 2023-07-18 DIAGNOSIS — I10 Essential (primary) hypertension: Secondary | ICD-10-CM | POA: Diagnosis not present

## 2023-07-18 DIAGNOSIS — R197 Diarrhea, unspecified: Secondary | ICD-10-CM | POA: Insufficient documentation

## 2023-07-18 DIAGNOSIS — E119 Type 2 diabetes mellitus without complications: Secondary | ICD-10-CM | POA: Diagnosis not present

## 2023-07-18 DIAGNOSIS — E876 Hypokalemia: Secondary | ICD-10-CM | POA: Diagnosis not present

## 2023-07-18 LAB — URINALYSIS, ROUTINE W REFLEX MICROSCOPIC
Bacteria, UA: NONE SEEN
Bilirubin Urine: NEGATIVE
Glucose, UA: >=500 mg/dL — AB
Hgb urine dipstick: NEGATIVE
Ketones, ur: NEGATIVE mg/dL
Leukocytes,Ua: NEGATIVE
Nitrite: NEGATIVE
Protein, ur: 100 mg/dL — AB
Specific Gravity, Urine: 1.023 (ref 1.005–1.030)
pH: 5 (ref 5.0–8.0)

## 2023-07-18 LAB — COMPREHENSIVE METABOLIC PANEL WITH GFR
ALT: 13 U/L (ref 0–44)
AST: 23 U/L (ref 15–41)
Albumin: 4.3 g/dL (ref 3.5–5.0)
Alkaline Phosphatase: 62 U/L (ref 38–126)
Anion gap: 13 (ref 5–15)
BUN: 7 mg/dL — ABNORMAL LOW (ref 8–23)
CO2: 28 mmol/L (ref 22–32)
Calcium: 10.5 mg/dL — ABNORMAL HIGH (ref 8.9–10.3)
Chloride: 97 mmol/L — ABNORMAL LOW (ref 98–111)
Creatinine, Ser: 0.79 mg/dL (ref 0.44–1.00)
GFR, Estimated: >60 mL/min (ref >60)
Glucose, Bld: 86 mg/dL (ref 70–99)
Potassium: 2.9 mmol/L — ABNORMAL LOW (ref 3.5–5.1)
Sodium: 138 mmol/L (ref 135–145)
Total Bilirubin: 0.5 mg/dL (ref 0.0–1.2)
Total Protein: 8.8 g/dL — ABNORMAL HIGH (ref 6.5–8.1)

## 2023-07-18 LAB — CBG MONITORING, ED: Glucose-Capillary: 71 mg/dL (ref 70–99)

## 2023-07-18 LAB — CBC
HCT: 43.4 % (ref 36.0–46.0)
Hemoglobin: 13.9 g/dL (ref 12.0–15.0)
MCH: 26.5 pg (ref 26.0–34.0)
MCHC: 32 g/dL (ref 30.0–36.0)
MCV: 82.8 fL (ref 80.0–100.0)
Platelets: 233 10*3/uL (ref 150–400)
RBC: 5.24 MIL/uL — ABNORMAL HIGH (ref 3.87–5.11)
RDW: 13 % (ref 11.5–15.5)
WBC: 8.4 10*3/uL (ref 4.0–10.5)
nRBC: 0 % (ref 0.0–0.2)

## 2023-07-18 LAB — LIPASE, BLOOD: Lipase: 35 U/L (ref 11–51)

## 2023-07-18 MED ORDER — POTASSIUM CHLORIDE 20 MEQ PO PACK: 20.0000 meq | PACK | Freq: Every day | ORAL | 0 refills | Status: AC

## 2023-07-18 MED ORDER — POTASSIUM CHLORIDE CRYS ER 20 MEQ PO TBCR
40.0000 meq | EXTENDED_RELEASE_TABLET | Freq: Once | ORAL | Status: AC
  Administered 2023-07-18: 40 meq via ORAL
  Filled 2023-07-18: qty 2

## 2023-07-18 MED ORDER — POTASSIUM CHLORIDE 10 MEQ/100ML IV SOLN
10.0000 meq | INTRAVENOUS | Status: AC
  Administered 2023-07-18 (×3): 10 meq via INTRAVENOUS
  Filled 2023-07-18 (×3): qty 100

## 2023-07-18 MED ORDER — SODIUM CHLORIDE 0.9 % IV BOLUS: 1000.0000 mL | Freq: Once | INTRAVENOUS | Status: DC

## 2023-07-18 MED ORDER — SODIUM CHLORIDE 0.9 % IV BOLUS
1000.0000 mL | Freq: Once | INTRAVENOUS | Status: AC
  Administered 2023-07-18: 1000 mL via INTRAVENOUS

## 2023-07-18 MED ORDER — ONDANSETRON HCL 4 MG/2ML IJ SOLN
4.0000 mg | Freq: Once | INTRAMUSCULAR | Status: AC
  Administered 2023-07-18: 4 mg via INTRAVENOUS
  Filled 2023-07-18: qty 2

## 2023-07-18 NOTE — ED Triage Notes (Addendum)
 Pt endorses diarrhea for the last three days and emesis that started today, unknown blood in either. Pt has been unable to keep anything down, wants her electrolytes checked. Denies chest pain/sob. Is on several diabetic medications, started mounjaro 6 months ago

## 2023-07-18 NOTE — ED Provider Notes (Signed)
 [place] EMERGENCY DEPARTMENT AT [facility] Provider Note   CSN: [id number] Arrival date & time: 07/18/23  0800     Patient presents with: Diarrhea and Emesis   Lori Crane is a 68 y.o. female with PMHx DM, HLD, HTN who presents to ED concerned for nausea, vomiting, and diarrhea. Diarrhea started 2 days ago. Patient vomited once this morning. Patient endorses feeling exhausted. Patient concerned because she is diabetic and is concerned about her electrolytes. Patient endorses some pink chunks in her emesis which she believes was the salmon that she ate last night - denies gross hematemesis.   Denies recent ABX use, hx of crohn's/IBS, recent travel, drinking from unsafe water sources, immunocompromise. Denies fever, cough, rhinorrhea, congestion, abdominal pain, hematochezia, urinary complaint.      Diarrhea Associated symptoms: vomiting   Emesis Associated symptoms: diarrhea        Prior to Admission medications   Medication Sig Start Date End Date Taking? Authorizing Provider  acetaminophen  (TYLENOL ) 325 MG tablet Take 2 tablets (650 mg total) by mouth every 6 (six) hours as needed for mild pain, fever or headache. 08/08/18   Elgergawy, Ardia Kraft, MD  amoxicillin  (AMOXIL ) 500 MG capsule Take 500 mg by mouth 3 (three) times daily. 09/06/21   [provider]  aspirin  EC 81 MG tablet Take 81 mg by mouth daily. Swallow whole.    [provider]  carvedilol (COREG) 25 MG tablet Take 25 mg by mouth 2 (two) times daily. 07/19/21   [provider]  chlorhexidine  (PERIDEX ) 0.12 % solution Use as directed 15 mLs in the mouth or throat 2 (two) times daily. 09/06/21   [provider]  empagliflozin (JARDIANCE) 25 MG TABS tablet Take 25 mg by mouth daily.  04/30/16   [provider]  fluconazole (DIFLUCAN) 150 MG tablet Take 150 mg by mouth once. 07/25/21   [provider]  furosemide  (LASIX ) 20 MG tablet Take 1 tablet (20 mg  total) by mouth daily. 09/17/21   Kraig Peru, MD  gabapentin  (NEURONTIN ) 300 MG capsule Take 600 mg by mouth 2 (two) times daily. 04/08/16   [provider]  glimepiride  (AMARYL ) 4 MG tablet Take 4 mg by mouth 2 (two) times a day. 02/02/18   [provider]  HYDROcodone -acetaminophen  (NORCO/VICODIN) 5-325 MG tablet Take 1 tablet by mouth every 4 (four) hours as needed. Patient taking differently: Take 1 tablet by mouth every 4 (four) hours as needed for moderate pain or severe pain. 02/27/17   Mandy Second, PA-C  hydrOXYzine  (ATARAX ) 25 MG tablet Take 1 tablet (25 mg total) by mouth every 6 (six) hours. 03/21/22   Debbra Fairy, PA-C  insulin  degludec (TRESIBA ) 100 UNIT/ML SOPN FlexTouch Pen Inject 65 Units into the skin daily. 02/02/18   [provider]  pantoprazole  (PROTONIX ) 40 MG tablet Take 1 tablet (40 mg total) by mouth daily. Patient taking differently: Take 40 mg by mouth daily as needed (Indigestion). 08/08/18   Elgergawy, Ardia Kraft, MD  pioglitazone (ACTOS) 45 MG tablet Take 45 mg by mouth daily. 07/09/21   [provider]  potassium chloride  (KLOR-CON ) 20 MEQ packet Take 20 mEq by mouth daily. 07/18/23   Paxton Bureau, PA-C  telmisartan  (MICARDIS ) 80 MG tablet Take 80 mg by mouth daily. 07/19/21   [provider]    Allergies: Metformin  and related    Review of Systems  Gastrointestinal:  Positive for diarrhea and vomiting.    Updated Vital Signs  BP (!) 156/79 (BP Location: Right Arm)   Pulse 94   Temp 98.7 F (37.1 C) (Oral)   Resp 14   SpO2 99%   Physical Exam Vitals and nursing note reviewed.  Constitutional:      General: She is not in acute distress.    Appearance: She is not ill-appearing or toxic-appearing.  HENT:     Head: Normocephalic and atraumatic.     Mouth/Throat:     Mouth: Mucous membranes are moist.   Eyes:     General: No scleral icterus.       Right eye: No discharge.        Left eye: No discharge.      Conjunctiva/sclera: Conjunctivae normal.    Cardiovascular:     Rate and Rhythm: Normal rate and regular rhythm.     Pulses: Normal pulses.     Heart sounds: Normal heart sounds. No murmur heard. Pulmonary:     Effort: Pulmonary effort is normal. No respiratory distress.     Breath sounds: Normal breath sounds. No wheezing, rhonchi or rales.  Abdominal:     General: Abdomen is flat. There is no distension.     Palpations: Abdomen is soft. There is no mass.     Tenderness: There is no abdominal tenderness.   Musculoskeletal:     Right lower leg: No edema.     Left lower leg: No edema.   Skin:    General: Skin is warm and dry.     Findings: No rash.   Neurological:     General: No focal deficit present.     Mental Status: She is alert and oriented to person, place, and time. Mental status is at baseline.   Psychiatric:        Mood and Affect: Mood normal.        Behavior: Behavior normal.     (all labs ordered are listed, but only abnormal results are displayed) Labs Reviewed  COMPREHENSIVE METABOLIC PANEL WITH GFR - Abnormal; Notable for the following components:      Result Value   Potassium 2.9 (*)    Chloride 97 (*)    BUN 7 (*)    Calcium  10.5 (*)    Total Protein 8.8 (*)    All other components within normal limits  CBC - Abnormal; Notable for the following components:   RBC 5.24 (*)    All other components within normal limits  URINALYSIS, ROUTINE W REFLEX MICROSCOPIC - Abnormal; Notable for the following components:   APPearance HAZY (*)    Glucose, UA >=500 (*)    Protein, ur 100 (*)    All other components within normal limits  LIPASE, BLOOD  CBG MONITORING, ED    EKG: EKG Interpretation Date/Time:  Friday July 18 2023 09:55:39 EDT Ventricular Rate:  91 PR Interval:  209 QRS Duration:  113 QT Interval:  393 QTC Calculation: 484 R Axis:   41  Text Interpretation: Sinus rhythm Borderline prolonged PR interval Consider left atrial enlargement  Low voltage, precordial leads Consider anterior infarct Confirmed by Angela Kell [phone]) on 07/18/2023 10:50:58 AM  Radiology: No results found.   Procedures   Medications Ordered in the ED  potassium chloride  10 mEq in 100 mL IVPB (10 mEq Intravenous New Bag/Given 07/18/23 1150)  potassium chloride  SA (KLOR-CON  M) CR tablet 40 mEq (has no administration in time range)  ondansetron  (ZOFRAN ) injection 4 mg (4 mg Intravenous Given 07/18/23 0935)  sodium chloride  0.9 % bolus  1,000 mL (1,000 mLs Intravenous New Bag/Given 07/18/23 0935)                                    Medical Decision Making Amount and/or Complexity of Data Reviewed Labs: ordered.  Risk Prescription drug management.  This patient presents to the ED for concern of nausea, vomiting, diarrhea, this involves an extensive number of treatment options, and is a complaint that carries with it a high risk of complications and morbidity.  The differential diagnosis includes acute viral gastroenteritis, UTI, appendicitis, cholecystitis,pancreatitis, nephrolithiasis, constipation, SBO   Co morbidities that complicate the patient evaluation  DM, HLD, HTN   Additional history obtained:  Dr. Meribeth Standard PCP   Problem List / ED Course / Critical interventions / Medication management  Patient presents to ED concern for diarrhea x 2-3 days and 1 episode of vomiting this morning.  Patient concerned because she has diabetes and is concerned for her electrolytes.  Physical exam reassuring.  Patient afebrile with stable vitals. I Ordered, and personally interpreted labs.  CBC without leukocytosis or anemia.  CMP with hypokalemia at 2.9.  Chloride is also little low at 97.  CBG 79.  Lipase within normal limits.  UA not concerning for infection. The patient was maintained on a cardiac monitor.  I personally viewed and interpreted the cardiac monitored which showed an underlying rhythm of: Sinus rhythm Provided patient with IV  potassium supplementation along with IV fluids and Zofran .  Patient stated she is feeling a lot better and like to go home.  Patient tolerating PO. I ordered patient 3 bags of IV potassium - patient stating that she would only like to stay for 2 bags worth since she feels a lot better. Patient asking me to refill her home potassium prescription which I will do.  Staffed with Dr. Reba Camper who agrees with plan. I have reviewed the patients home medicines and have made adjustments as needed The patient has been appropriately medically screened and/or stabilized in the ED. I have low suspicion for any other emergent medical condition which would require further screening, evaluation or treatment in the ED or require inpatient management. At time of discharge the patient is hemodynamically stable and in no acute distress. I have discussed work-up results and diagnosis with patient and answered all questions. Patient is agreeable with discharge plan. We discussed strict return precautions for returning to the emergency department and they verbalized understanding.     Social Determinants of Health:  none          Final diagnoses:  Nausea vomiting and diarrhea  Hypokalemia    ED Discharge Orders          Ordered    potassium chloride  (KLOR-CON ) 20 MEQ packet  Daily        07/18/23 1320               Barnhill Bureau, [place] 07/18/23 1326    Burnette Carte, MD 07/19/23 1558

## 2023-07-18 NOTE — Discharge Instructions (Addendum)
 I am glad you are feeling better.  Please follow-up with a primary care provider.  Seek emergency care experiencing any new or worsening symptoms.
